# Patient Record
Sex: Male | Born: 1960 | Race: White | Hispanic: No | Marital: Married | Smoking: Never smoker
Health system: Southern US, Community
[De-identification: ages and names within clinical notes are randomized; demographics above are authoritative.]

## PROBLEM LIST (undated history)

## (undated) DIAGNOSIS — I10 Essential (primary) hypertension: Secondary | ICD-10-CM

## (undated) DIAGNOSIS — E119 Type 2 diabetes mellitus without complications: Secondary | ICD-10-CM

---

## 2001-02-07 ENCOUNTER — Emergency Department (HOSPITAL_COMMUNITY): Admission: EM | Admit: 2001-02-07 | Discharge: 2001-02-07 | Payer: Self-pay | Admitting: *Deleted

## 2003-04-30 ENCOUNTER — Emergency Department (HOSPITAL_COMMUNITY): Admission: EM | Admit: 2003-04-30 | Discharge: 2003-05-01 | Payer: Self-pay | Admitting: Emergency Medicine

## 2003-05-01 ENCOUNTER — Emergency Department (HOSPITAL_COMMUNITY): Admission: EM | Admit: 2003-05-01 | Discharge: 2003-05-01 | Payer: Self-pay | Admitting: Emergency Medicine

## 2004-09-04 ENCOUNTER — Emergency Department (HOSPITAL_COMMUNITY): Admission: EM | Admit: 2004-09-04 | Discharge: 2004-09-04 | Payer: Self-pay | Admitting: Family Medicine

## 2004-10-03 ENCOUNTER — Emergency Department (HOSPITAL_COMMUNITY): Admission: EM | Admit: 2004-10-03 | Discharge: 2004-10-03 | Payer: Self-pay | Admitting: Family Medicine

## 2005-12-15 ENCOUNTER — Emergency Department (HOSPITAL_COMMUNITY): Admission: EM | Admit: 2005-12-15 | Discharge: 2005-12-15 | Payer: Self-pay | Admitting: Family Medicine

## 2007-10-20 ENCOUNTER — Emergency Department (HOSPITAL_COMMUNITY): Admission: EM | Admit: 2007-10-20 | Discharge: 2007-10-20 | Payer: Self-pay | Admitting: Emergency Medicine

## 2007-10-31 ENCOUNTER — Emergency Department (HOSPITAL_COMMUNITY): Admission: EM | Admit: 2007-10-31 | Discharge: 2007-10-31 | Payer: Self-pay | Admitting: Family Medicine

## 2008-02-23 ENCOUNTER — Emergency Department (HOSPITAL_COMMUNITY): Admission: EM | Admit: 2008-02-23 | Discharge: 2008-02-23 | Payer: Self-pay | Admitting: Family Medicine

## 2008-07-25 ENCOUNTER — Emergency Department (HOSPITAL_COMMUNITY): Admission: EM | Admit: 2008-07-25 | Discharge: 2008-07-25 | Payer: Self-pay | Admitting: Family Medicine

## 2009-09-23 ENCOUNTER — Emergency Department (HOSPITAL_COMMUNITY): Admission: EM | Admit: 2009-09-23 | Discharge: 2009-09-23 | Payer: Self-pay | Admitting: Emergency Medicine

## 2009-09-24 ENCOUNTER — Emergency Department (HOSPITAL_COMMUNITY): Admission: EM | Admit: 2009-09-24 | Discharge: 2009-09-24 | Payer: Self-pay | Admitting: Family Medicine

## 2009-12-06 ENCOUNTER — Emergency Department (HOSPITAL_COMMUNITY)
Admission: EM | Admit: 2009-12-06 | Discharge: 2009-12-06 | Payer: Self-pay | Source: Home / Self Care | Admitting: Family Medicine

## 2010-04-22 LAB — POCT URINALYSIS DIP (DEVICE)
Glucose, UA: NEGATIVE mg/dL
Ketones, ur: NEGATIVE mg/dL
pH: 7 (ref 5.0–8.0)

## 2010-10-07 LAB — URINALYSIS, ROUTINE W REFLEX MICROSCOPIC
Bilirubin Urine: NEGATIVE
Ketones, ur: NEGATIVE
Specific Gravity, Urine: 1.022
pH: 5.5

## 2010-10-07 LAB — DIFFERENTIAL
Basophils Relative: 0
Eosinophils Absolute: 0.1
Eosinophils Relative: 1
Neutrophils Relative %: 85 — ABNORMAL HIGH

## 2010-10-07 LAB — CBC
MCHC: 34.1
RBC: 5.21
RDW: 12.9

## 2010-10-07 LAB — POCT I-STAT, CHEM 8
Creatinine, Ser: 1.1
HCT: 47
Hemoglobin: 16
TCO2: 27

## 2010-10-07 LAB — URINE MICROSCOPIC-ADD ON

## 2019-03-24 ENCOUNTER — Ambulatory Visit: Payer: Self-pay | Attending: Internal Medicine

## 2019-03-24 DIAGNOSIS — Z23 Encounter for immunization: Secondary | ICD-10-CM

## 2019-03-24 NOTE — Progress Notes (Signed)
   Covid-19 Vaccination Clinic  Name:  Sean Sutton    MRN: [id number] DOB: [birth year]  03/24/2019  Sean Sutton was observed post Covid-19 immunization for 15 minutes without incident. He was provided with Vaccine Information Sheet and instruction to access the V-Safe system.   Sean Sutton was instructed to call 911 with any severe reactions post vaccine: Marland Kitchen Difficulty breathing  . Swelling of face and throat  . A fast heartbeat  . A bad rash all over body  . Dizziness and weakness   Immunizations Administered    Name Date Dose VIS Date Route   Pfizer COVID-19 Vaccine 03/24/2019  9:07 AM 0.3 mL 12/16/2018 Intramuscular   Manufacturer: [employer], [employer]   Lot: [id number]   NDC: [address]-5

## 2019-04-18 ENCOUNTER — Ambulatory Visit: Payer: Self-pay | Attending: Internal Medicine

## 2019-04-18 DIAGNOSIS — Z23 Encounter for immunization: Secondary | ICD-10-CM

## 2019-04-18 NOTE — Progress Notes (Signed)
   Covid-19 Vaccination Clinic  Name:  Sean Sutton    MRN: [id number] DOB: 1960/11/11  04/18/2019  Mr. Sean Sutton was observed post Covid-19 immunization for 15 minutes without incident. He was provided with Vaccine Information Sheet and instruction to access the V-Safe system.   Mr. Sean Sutton was instructed to call 911 with any severe reactions post vaccine: Marland Kitchen Difficulty breathing  . Swelling of face and throat  . A fast heartbeat  . A bad rash all over body  . Dizziness and weakness   Immunizations Administered    Name Date Dose VIS Date Route   Pfizer COVID-19 Vaccine 04/18/2019  9:42 AM 0.3 mL 12/16/2018 Intramuscular   Manufacturer: [employer], [employer]   Lot: [id number]   NDC: [address]-9

## 2020-01-12 ENCOUNTER — Emergency Department (HOSPITAL_COMMUNITY)
Admission: EM | Admit: 2020-01-12 | Discharge: 2020-01-12 | Disposition: A | Discharge status: Home / Self Care | Payer: No Typology Code available for payment source | Attending: Emergency Medicine | Admitting: Emergency Medicine

## 2020-01-12 ENCOUNTER — Encounter (HOSPITAL_COMMUNITY): Payer: Self-pay | Admitting: *Deleted

## 2020-01-12 ENCOUNTER — Other Ambulatory Visit: Payer: Self-pay

## 2020-01-12 ENCOUNTER — Emergency Department (HOSPITAL_COMMUNITY): Payer: No Typology Code available for payment source

## 2020-01-12 DIAGNOSIS — E119 Type 2 diabetes mellitus without complications: Secondary | ICD-10-CM | POA: Insufficient documentation

## 2020-01-12 DIAGNOSIS — R202 Paresthesia of skin: Secondary | ICD-10-CM | POA: Insufficient documentation

## 2020-01-12 DIAGNOSIS — G510 Bell's palsy: Secondary | ICD-10-CM

## 2020-01-12 DIAGNOSIS — R29810 Facial weakness: Secondary | ICD-10-CM | POA: Insufficient documentation

## 2020-01-12 DIAGNOSIS — I10 Essential (primary) hypertension: Secondary | ICD-10-CM | POA: Insufficient documentation

## 2020-01-12 HISTORY — DX: Type 2 diabetes mellitus without complications: E11.9

## 2020-01-12 HISTORY — DX: Essential (primary) hypertension: I10

## 2020-01-12 MED ORDER — PREDNISONE 20 MG PO TABS
60.0000 mg | ORAL_TABLET | Freq: Once | ORAL | Status: AC
  Administered 2020-01-12: 60 mg via ORAL
  Filled 2020-01-12: qty 3

## 2020-01-12 MED ORDER — VALACYCLOVIR HCL 500 MG PO TABS
1000.0000 mg | ORAL_TABLET | Freq: Once | ORAL | Status: AC
  Administered 2020-01-12: 1000 mg via ORAL
  Filled 2020-01-12: qty 2

## 2020-01-12 MED ORDER — PREDNISONE 20 MG PO TABS: ORAL_TABLET | ORAL | 0 refills | Status: AC

## 2020-01-12 MED ORDER — VALACYCLOVIR HCL 1 G PO TABS: 1000.0000 mg | ORAL_TABLET | Freq: Three times a day (TID) | ORAL | 0 refills | Status: AC

## 2020-01-12 MED ORDER — ARTIFICIAL TEARS OPHTHALMIC OINT: TOPICAL_OINTMENT | Freq: Every evening | OPHTHALMIC | 0 refills | Status: AC | PRN

## 2020-01-12 NOTE — ED Provider Notes (Signed)
Care transferred to me.  On my exam, patient has a mild Bell's palsy overall but he does seem to have some mildly decreased forehead movement which would go along with a facial nerve palsy.  While he does have risk factor for stroke I think this is unlikely to be stroke and do not think emergent MRI is needed.  The CT head does not show any acute abnormalities besides some sinusitis.  He is afebrile.  He does have diabetes but seems to be decently controlled per him and so we will start on steroids, valacyclovir and Lacri-Lube.  Follow-up with ENT and PCP.  Return precautions.   Pricilla Loveless, MD 01/12/20 [phone]

## 2020-01-12 NOTE — ED Provider Notes (Signed)
[facility]-EMERGENCY DEPT Provider Note   CSN: [id number] Arrival date & time: 01/12/20  1320     History Chief Complaint  Patient presents with  . Facial Droop    Since 930am    Sean Sutton is a 60 y.o. male.  60 year old male with history of diabetes hypertension who presents with right-sided facial paresthesias and droop since this morning.  Denies any headaches.  No ear pain or rash.  Denies any visual changes such as loss of vision.  No weakness in his arms or legs.  No ataxia.  Has noted trouble closing his right eye and seems to be irritated.  Denies any tongue swelling or irritation.  Symptoms have been persistent and no prior history of same.        Past Medical History:  Diagnosis Date  . Diabetes mellitus without complication (HCC)   . Hypertension     There are no problems to display for this patient.   History reviewed. No pertinent surgical history.     No family history on file.  Social History   Tobacco Use  . Smoking status: Never Smoker  . Smokeless tobacco: Never Used  Substance Use Topics  . Alcohol use: Yes    Home Medications Prior to Admission medications   Not on File    Allergies    Patient has no allergy information on record.  Review of Systems   Review of Systems  All other systems reviewed and are negative.   Physical Exam Updated Vital Signs BP (!) 179/97 (BP Location: Right Arm)   Pulse 63   Temp 97.6 F (36.4 C) (Oral)   Resp 18   SpO2 99%   Physical Exam Vitals and nursing note reviewed.  Constitutional:      General: He is not in acute distress.    Appearance: Normal appearance. He is well-developed and well-nourished. He is not toxic-appearing.  HENT:     Head: Normocephalic and atraumatic.  Eyes:     General: Lids are normal.     Extraocular Movements: EOM normal.     Conjunctiva/sclera: Conjunctivae normal.     Pupils: Pupils are equal, round, and reactive to light.  Neck:      Thyroid: No thyroid mass.     Trachea: No tracheal deviation.  Cardiovascular:     Rate and Rhythm: Normal rate and regular rhythm.     Heart sounds: Normal heart sounds. No murmur heard. No gallop.   Pulmonary:     Effort: Pulmonary effort is normal. No respiratory distress.     Breath sounds: Normal breath sounds. No stridor. No decreased breath sounds, wheezing, rhonchi or rales.  Abdominal:     General: Bowel sounds are normal. There is no distension.     Palpations: Abdomen is soft.     Tenderness: There is no abdominal tenderness. There is no CVA tenderness or rebound.  Musculoskeletal:        General: No tenderness or edema. Normal range of motion.     Cervical back: Normal range of motion and neck supple.  Skin:    General: Skin is warm and dry.     Findings: No abrasion or rash.  Neurological:     Mental Status: He is alert and oriented to person, place, and time.     GCS: GCS eye subscore is 4. GCS verbal subscore is 5. GCS motor subscore is 6.     Cranial Nerves: Facial asymmetry present. No cranial nerve deficit  or dysarthria.     Sensory: Sensation is intact. No sensory deficit.     Motor: Motor function is intact.     Coordination: Coordination is intact.     Gait: Gait is intact.     Deep Tendon Reflexes: Strength normal.     Comments: Decrease sensation noted on right side of face compared to left.  Psychiatric:        Mood and Affect: Mood and affect normal.        Speech: Speech normal.        Behavior: Behavior normal.     ED Results / Procedures / Treatments   Labs (all labs ordered are listed, but only abnormal results are displayed) Labs Reviewed - No data to display  EKG None  Radiology No results found.  Procedures Procedures (including critical care time)  Medications Ordered in ED Medications - No data to display  ED Course  I have reviewed the triage vital signs and the nursing notes.  Pertinent labs & imaging results that were  available during my care of the patient were reviewed by me and considered in my medical decision making (see chart for details).    MDM Rules/Calculators/A&P                          Patient with likely Bell's palsy.  Will order head CT due to his history hypertension as well as diabetes.  Signed out to Dr. Gwenlyn Fudge Final Clinical Impression(s) / ED Diagnoses Final diagnoses:  None    Rx / DC Orders ED Discharge Orders    None       Lorre Nick, MD 01/12/20 1512

## 2020-01-12 NOTE — ED Triage Notes (Signed)
Pt complains of right lower facial droop and numbness since this morning. He noticed it at 930am , he woke up at 8am. No slurred speech, no arm drift. No dizziness.

## 2020-01-12 NOTE — Discharge Instructions (Signed)
If you develop headache, fever, neck stiffness, vomiting, blurry or double vision, weakness or numbness in your arms or legs, trouble speaking, or any other new/concerning symptoms then return to the ER for evaluation.

## 2020-02-09 ENCOUNTER — Ambulatory Visit: Payer: Self-pay | Admitting: Medical

## 2020-06-12 DIAGNOSIS — R809 Proteinuria, unspecified: Secondary | ICD-10-CM | POA: Diagnosis not present

## 2020-06-12 DIAGNOSIS — E1129 Type 2 diabetes mellitus with other diabetic kidney complication: Secondary | ICD-10-CM | POA: Diagnosis not present

## 2020-06-12 DIAGNOSIS — Z1159 Encounter for screening for other viral diseases: Secondary | ICD-10-CM | POA: Diagnosis not present

## 2020-06-12 DIAGNOSIS — I10 Essential (primary) hypertension: Secondary | ICD-10-CM | POA: Diagnosis not present

## 2020-06-12 DIAGNOSIS — K219 Gastro-esophageal reflux disease without esophagitis: Secondary | ICD-10-CM | POA: Diagnosis not present

## 2020-06-12 DIAGNOSIS — E785 Hyperlipidemia, unspecified: Secondary | ICD-10-CM | POA: Diagnosis not present

## 2020-06-12 DIAGNOSIS — Z79899 Other long term (current) drug therapy: Secondary | ICD-10-CM | POA: Diagnosis not present

## 2020-09-12 DIAGNOSIS — I10 Essential (primary) hypertension: Secondary | ICD-10-CM | POA: Diagnosis not present

## 2020-09-12 DIAGNOSIS — E785 Hyperlipidemia, unspecified: Secondary | ICD-10-CM | POA: Diagnosis not present

## 2020-09-12 DIAGNOSIS — M25511 Pain in right shoulder: Secondary | ICD-10-CM | POA: Diagnosis not present

## 2020-09-12 DIAGNOSIS — R809 Proteinuria, unspecified: Secondary | ICD-10-CM | POA: Diagnosis not present

## 2020-09-12 DIAGNOSIS — E1129 Type 2 diabetes mellitus with other diabetic kidney complication: Secondary | ICD-10-CM | POA: Diagnosis not present

## 2020-09-12 DIAGNOSIS — Z23 Encounter for immunization: Secondary | ICD-10-CM | POA: Diagnosis not present

## 2020-12-12 DIAGNOSIS — E1129 Type 2 diabetes mellitus with other diabetic kidney complication: Secondary | ICD-10-CM | POA: Diagnosis not present

## 2020-12-12 DIAGNOSIS — I10 Essential (primary) hypertension: Secondary | ICD-10-CM | POA: Diagnosis not present

## 2020-12-12 DIAGNOSIS — E785 Hyperlipidemia, unspecified: Secondary | ICD-10-CM | POA: Diagnosis not present

## 2020-12-12 DIAGNOSIS — R809 Proteinuria, unspecified: Secondary | ICD-10-CM | POA: Diagnosis not present

## 2021-01-02 DIAGNOSIS — R059 Cough, unspecified: Secondary | ICD-10-CM | POA: Diagnosis not present

## 2021-01-02 DIAGNOSIS — J329 Chronic sinusitis, unspecified: Secondary | ICD-10-CM | POA: Diagnosis not present

## 2021-01-02 DIAGNOSIS — R6889 Other general symptoms and signs: Secondary | ICD-10-CM | POA: Diagnosis not present

## 2021-01-08 ENCOUNTER — Other Ambulatory Visit: Payer: Self-pay

## 2021-01-08 ENCOUNTER — Ambulatory Visit: Payer: No Typology Code available for payment source | Attending: Orthopedic Surgery

## 2021-01-08 DIAGNOSIS — M25562 Pain in left knee: Secondary | ICD-10-CM | POA: Diagnosis present

## 2021-01-08 DIAGNOSIS — R2681 Unsteadiness on feet: Secondary | ICD-10-CM | POA: Diagnosis present

## 2021-01-08 DIAGNOSIS — M6281 Muscle weakness (generalized): Secondary | ICD-10-CM | POA: Insufficient documentation

## 2021-01-08 NOTE — Therapy (Signed)
OUTPATIENT PHYSICAL THERAPY LOWER EXTREMITY EVALUATION   Patient Name: Sean Sutton MRN: [id number] DOB:[birth year], 61 y.o., male Today's Date: 01/08/2021   PT End of Session - 01/08/21 1031     Visit Number 1    Number of Visits 15    Date for PT Re-Evaluation 02/05/21    Authorization Type [facility]    Authorization Time Period 01/08/21-03/05/21    Progress Note Due on Visit 10    PT Start Time 1035    PT Stop Time 1130    PT Time Calculation (min) 55 min    Activity Tolerance Patient tolerated treatment well    Behavior During Therapy WFL for tasks assessed/performed             Past Medical History:  Diagnosis Date   Diabetes mellitus without complication (HCC)    Hypertension    History reviewed. No pertinent surgical history. There are no problems to display for this patient.   PCP: [facility]  REFERRING PROVIDER: Julien Girt, Judeth Porch,*  REFERRING DIAG: L knee arthroscopic 10/17/20  THERAPY DIAG: L knee arthroscopic 10/17/20  ONSET DATE: 10/17/20  SUBJECTIVE:   SUBJECTIVE STATEMENT: L knee mensicus repair, symptoms ongoing for several years   PERTINENT HISTORY: Previous PT due to history of chronic L knee pain, underwent several injections, post-op NWB x 6 weeks, incurred post-op infection and cellulitis  PAIN:  Are you having pain? Yes VAS scale: 4/10 Pain location: L knee Pain orientation: Left  PAIN TYPE: dull Pain description: intermittent  Aggravating factors: Kneeling, bending, overuse Relieving factors: rest  PRECAUTIONS: None  WEIGHT BEARING RESTRICTIONS No  FALLS:  Has patient fallen in last 6 months? No, Number of falls: 0  LIVING ENVIRONMENT: Lives with: lives with their family Lives in: House/apartment Stairs: No;   OCCUPATION: retired  PLOF: Independent  PATIENT GOALS To regain ROM, strength and function   OBJECTIVE:   DIAGNOSTIC FINDINGS: N/A  PATIENT SURVEYS:  FOTO 41    MUSCLE LENGTH: Hamstrings:  Right 80 deg; Left 70 deg Thomas test: negative L  POSTURE:  NT  PALPATION: TTP medial joint line L knee  LE AROM/PROM:  A/PROM Right 01/08/2021 Left 01/08/2021  Hip flexion [facility]  Hip extension    Hip abduction    Hip adduction    Hip internal rotation    Hip external rotation    Knee flexion 134 104  Knee extension 9 -2/0  Ankle dorsiflexion    Ankle plantarflexion    Ankle inversion    Ankle eversion     (Blank rows = not tested)  LE MMT:  MMT Right 01/08/2021 Left 01/08/2021  Hip flexion 5 5  Hip extension 5 5  Hip abduction    Hip adduction    Hip internal rotation    Hip external rotation    Knee flexion 5 4  Knee extension 5 4  Ankle dorsiflexion    Ankle plantarflexion    Ankle inversion    Ankle eversion     (Blank rows = not tested)  LOWER EXTREMITY SPECIAL TESTS:  Hip special tests: Maisie Fus test: negative Knee special tests: Patellafemoral apprehension test: negative and Patellafemoral grind test: negative  FUNCTIONAL TESTS:  5 times sit to stand: 16s with BUE support  GAIT: Distance walked: 247ft Assistive device utilized: None Level of assistance: Modified independence Comments: decreased WB LLE    TODAY'S TREATMENT: HEP, Eval   PATIENT EDUCATION:  Education details: Discussed eval findings, rehab rationale and POC and patient  is in agreement  Person educated: Patient Education method: Explanation, Demonstration, and Handouts Education comprehension: verbalized understanding and returned demonstration   HOME EXERCISE PROGRAM: Access Code: [email or web address] URL: [email or web address] Date: 01/08/2021 Prepared by: Gustavus Bryant  Exercises Sidelying Hip Abduction - 2 x daily - 7 x weekly - 1 sets - 30 reps Supine Straight Leg Raises - 2 x daily - 7 x weekly - 1 sets - 30 reps Long Sitting Quad Set - 2 x daily - 7 x weekly - 1 sets - 30 reps Supine Heel Slide with Strap - 2 x daily - 7 x weekly - 1 sets - 30  reps   ASSESSMENT:  CLINICAL IMPRESSION: Patient is a 61 y.o. male who was seen today for physical therapy evaluation and treatment for L knee pain and weakness. He ambulates with decreased WB on LLE, decreased stance time on L and decreased flexion in swing phase.Objective impairments include Abnormal gait, decreased activity tolerance, decreased balance, decreased endurance, decreased mobility, difficulty walking, decreased ROM, decreased strength, and impaired flexibility. These impairments are limiting patient from community activity, driving, yard work, and shopping. Personal factors including Age, Past/current experiences, and Time since onset of injury/illness/exacerbation are also affecting patient's functional outcome. Patient will benefit from skilled PT to address above impairments and improve overall function.  REHAB POTENTIAL: Good  CLINICAL DECISION MAKING: Stable/uncomplicated  EVALUATION COMPLEXITY: Low   GOALS: Goals reviewed with patient? Yes  SHORT TERM GOALS:  STG Name Target Date Goal status  1 Patient to demonstrate independence in HEP  Baseline: Access Code: [email or web address] U  02/05/2021 INITIAL  2 Complete  STS w/o UE support Baseline: Requires BUE support 02/05/2021 INITIAL  3 110d L knee flexion AROM Baseline:104d AROM in flexion 02/05/2021 INITIAL  4 Decrease pain to 2/10 Baseline:4/10 02/05/2021 INITIAL   LONG TERM GOALS:   LTG Name Target Date Goal status  1 135d AROM flexion in L knee Baseline:104d flexion 03/05/2021 INITIAL  2 FOTO score of 58 Baseline:41 03/05/2021 INITIAL  3 5/5 L kne extension Baseline: 03/05/2021 INITIAL  4 Patient to perform 5x STS in 15s w/o UE support Baseline: 03/05/2021 INITIAL   PLAN: PT FREQUENCY: 2x/week  PT DURATION: 8 weeks  PLANNED INTERVENTIONS: Therapeutic exercises, Therapeutic activity, Neuro Muscular re-education, Balance training, Gait training, Patient/Family education, Joint mobilization, Stair training, and DME  instructions  PLAN FOR NEXT SESSION: HEP f/u, knee ROM, strength and balance tasks    Hildred Laser PT 01/08/2021, 10:33 AM

## 2021-01-13 ENCOUNTER — Ambulatory Visit: Payer: No Typology Code available for payment source

## 2021-01-13 ENCOUNTER — Other Ambulatory Visit: Payer: Self-pay

## 2021-01-13 DIAGNOSIS — M6281 Muscle weakness (generalized): Secondary | ICD-10-CM

## 2021-01-13 DIAGNOSIS — R2681 Unsteadiness on feet: Secondary | ICD-10-CM

## 2021-01-13 DIAGNOSIS — M25562 Pain in left knee: Secondary | ICD-10-CM

## 2021-01-13 NOTE — Therapy (Signed)
OUTPATIENT PHYSICAL THERAPY TREATMENT NOTE   Patient Name: Sean Sutton MRN: [id number] DOB:1961/01/02, 61 y.o., male Today's Date: 01/13/2021  PCP: [facility] REFERRING PROVIDER: Lauraine Rinne,*   PT End of Session - 01/13/21 4270     Visit Number 2    Number of Visits 15    Date for PT Re-Evaluation 02/05/21    Authorization Type [facility]    Authorization Time Period 01/08/21-03/05/21    Progress Note Due on Visit 10    PT Start Time 0830    PT Stop Time 0915    PT Time Calculation (min) 45 min    Activity Tolerance Patient tolerated treatment well    Behavior During Therapy [facility] for tasks assessed/performed             Past Medical History:  Diagnosis Date   Diabetes mellitus without complication (HCC)    Hypertension    History reviewed. No pertinent surgical history. There are no problems to display for this patient.   REFERRING DIAG: L meniscus repair   THERAPY DIAG: L knee meniscus tear   PERTINENT HISTORY: see eval  PRECAUTIONS: none  SUBJECTIVE: No changes to report  PAIN:  Are you having pain? No VAS scale: 2/10 Pain location: L knee Pain orientation: Left  PAIN TYPE: aching Pain description: intermittent      OBJECTIVE:    DIAGNOSTIC FINDINGS: N/A   PATIENT SURVEYS:  FOTO 41       MUSCLE LENGTH: Hamstrings: Right 80 deg; Left 70 deg Thomas test: negative L   POSTURE:  NT   PALPATION: TTP medial joint line L knee   LE AROM/PROM:   A/PROM Right 01/08/2021 Left 01/08/2021  Hip flexion [facility]  Hip extension      Hip abduction      Hip adduction      Hip internal rotation      Hip external rotation      Knee flexion 134 104  Knee extension 9 -2/0  Ankle dorsiflexion      Ankle plantarflexion      Ankle inversion      Ankle eversion       (Blank rows = not tested)   LE MMT:   MMT Right 01/08/2021 Left 01/08/2021  Hip flexion 5 5  Hip extension 5 5  Hip abduction      Hip adduction      Hip internal  rotation      Hip external rotation      Knee flexion 5 4  Knee extension 5 4  Ankle dorsiflexion      Ankle plantarflexion      Ankle inversion      Ankle eversion       (Blank rows = not tested)   LOWER EXTREMITY SPECIAL TESTS:  Hip special tests: Maisie Fus test: negative Knee special tests: Patellafemoral apprehension test: negative and Patellafemoral grind test: negative   FUNCTIONAL TESTS:  5 times sit to stand: 16s with BUE support   GAIT: Distance walked: 215ft Assistive device utilized: None Level of assistance: Modified independence Comments: decreased WB LLE       TODAY'S TREATMENT: [facility] Adult PT Treatment:                                                DATE: 01/13/21 Therapeutic Exercise: Nustep L2, arms 8,  8 min. Heelslides with strap x 30 SLR x30 L hamstring stretch 30s x3 SAQs x30 Sidelie abduction x30 Bridge with ball squeeze Hooklie clams, yellow band, 30x LAQs with ball squeeze 30x Manual Therapy: Medial patellar glides     PATIENT EDUCATION:  Education details: Discussed eval findings, rehab rationale and POC and patient is in agreement  Person educated: Patient Education method: Explanation, Demonstration, and Handouts Education comprehension: verbalized understanding and returned demonstration     HOME EXERCISE PROGRAM: Access Code: [email or web address] URL: [email or web address] Date: 01/08/2021 Prepared by: Gustavus Bryant   Exercises Sidelying Hip Abduction - 2 x daily - 7 x weekly - 1 sets - 30 reps Supine Straight Leg Raises - 2 x daily - 7 x weekly - 1 sets - 30 reps Long Sitting Quad Set - 2 x daily - 7 x weekly - 1 sets - 30 reps Supine Heel Slide with Strap - 2 x daily - 7 x weekly - 1 sets - 30 reps     ASSESSMENT:   CLINICAL IMPRESSION: No increased symptoms following last session.  Began aerobic conditioning, ROM tasks, strengthening of L knee and hip.  Added patellar mobs to stretch lateral patellar ligaments, showing signs  of ITB tightness most likely due to immobility following surgery and NWB status.  108d flexion following heel slides.    REHAB POTENTIAL: Good   CLINICAL DECISION MAKING: Stable/uncomplicated   EVALUATION COMPLEXITY: Low     GOALS: Goals reviewed with patient? Yes   SHORT TERM GOALS:   STG Name Target Date Goal status  1 Patient to demonstrate independence in HEP  Baseline: Access Code: [email or web address] U   02/05/2021 INITIAL  2 Complete  STS w/o UE support Baseline: Requires BUE support 02/05/2021 INITIAL  3 110d L knee flexion AROM Baseline:104d AROM in flexion 02/05/2021 INITIAL  4 Decrease pain to 2/10 Baseline:4/10 02/05/2021 INITIAL    LONG TERM GOALS:    LTG Name Target Date Goal status  1 135d AROM flexion in L knee Baseline:104d flexion 03/05/2021 INITIAL  2 FOTO score of 58 Baseline:41 03/05/2021 INITIAL  3 5/5 L knee extension Baseline: 4/5 03/05/2021 INITIAL  4 Patient to perform 5x STS in 15s w/o UE support Baseline: 03/05/2021 INITIAL    PLAN: PT FREQUENCY: 2x/week   PT DURATION: 8 weeks   PLANNED INTERVENTIONS: Therapeutic exercises, Therapeutic activity, Neuro Muscular re-education, Balance training, Gait training, Patient/Family education, Joint mobilization, Stair training, and DME instructions   PLAN FOR NEXT SESSION: L knee ROM, strength and balance tasks, CKC, patellar mob and ITB stretch    Farris Has Armetta Henri PT 01/13/2021, 8:59 AM

## 2021-01-15 ENCOUNTER — Other Ambulatory Visit: Payer: Self-pay

## 2021-01-15 ENCOUNTER — Ambulatory Visit: Payer: No Typology Code available for payment source | Admitting: Physical Therapy

## 2021-01-15 ENCOUNTER — Encounter: Payer: Self-pay | Admitting: Physical Therapy

## 2021-01-15 DIAGNOSIS — M6281 Muscle weakness (generalized): Secondary | ICD-10-CM

## 2021-01-15 DIAGNOSIS — R2681 Unsteadiness on feet: Secondary | ICD-10-CM

## 2021-01-15 DIAGNOSIS — M25562 Pain in left knee: Secondary | ICD-10-CM

## 2021-01-15 NOTE — Therapy (Addendum)
OUTPATIENT PHYSICAL THERAPY TREATMENT NOTE   Patient Name: Sean Sutton MRN: [id number] DOB:06-Feb-1960, 61 y.o., male Today's Date: 01/15/2021  PCP: [facility] REFERRING PROVIDER: Lauraine Rinne,*   PT End of Session - 01/15/21 1021     Visit Number 3    Number of Visits 15    Date for PT Re-Evaluation 02/05/21    Authorization Type [facility]    Authorization Time Period 01/08/21-03/05/21    Progress Note Due on Visit 10    PT Start Time 1016    PT Stop Time 1100    PT Time Calculation (min) 44 min             Past Medical History:  Diagnosis Date   Diabetes mellitus without complication (HCC)    Hypertension    History reviewed. No pertinent surgical history. There are no problems to display for this patient.   REFERRING DIAG: L meniscus repair   THERAPY DIAG: L knee meniscus tear   PERTINENT HISTORY: see eval  PRECAUTIONS: none  SUBJECTIVE: No changes to report  PAIN:  Are you having pain? No VAS scale: 0/10 Pain location: L knee Pain orientation: Left  PAIN TYPE: stiff Pain description: intermittent      OBJECTIVE:    DIAGNOSTIC FINDINGS: N/A   PATIENT SURVEYS:  FOTO 41       MUSCLE LENGTH: Hamstrings: Right 80 deg; Left 70 deg Thomas test: negative L   POSTURE:  NT   PALPATION: TTP medial joint line L knee   LE AROM/PROM:   A/PROM Right 01/08/2021 Left 01/08/2021 Left  01/15/2021  Hip flexion [facility]   Hip extension       Hip abduction       Hip adduction       Hip internal rotation       Hip external rotation       Knee flexion 134 104 125  Knee extension 9 -2/0   Ankle dorsiflexion       Ankle plantarflexion       Ankle inversion       Ankle eversion        (Blank rows = not tested)   LE MMT:   MMT Right 01/08/2021 Left 01/08/2021  Hip flexion 5 5  Hip extension 5 5  Hip abduction      Hip adduction      Hip internal rotation      Hip external rotation      Knee flexion 5 4  Knee extension 5 4   Ankle dorsiflexion      Ankle plantarflexion      Ankle inversion      Ankle eversion       (Blank rows = not tested)   LOWER EXTREMITY SPECIAL TESTS:  Hip special tests: Maisie Fus test: negative Knee special tests: Patellafemoral apprehension test: negative and Patellafemoral grind test: negative   FUNCTIONAL TESTS:  5 times sit to stand: 16s with BUE support   GAIT: Distance walked: 289ft Assistive device utilized: None Level of assistance: Modified independence Comments: decreased WB LLE     TODAY'S TREATMENT: [facility] Adult PT Treatment:                                                DATE: 01/15/21 Therapeutic Exercise: Nustep L5, arms 9, 6 min Standing knee flexion x  10 Gastroc stretch 2 x 30 sec each Tandem stance 30 sec -added head turns for challenge Standing ITB stretch bilat x 30 sec  Seated hamstring curls red band x 20 L LAQs with ball squeeze 30x L hamstring stretch 30s x3, supine with strap L ITB stretch supine with strap  Heelslides with strap x 30- ROM 125 After SLR x30  Manual Therapy: Medial patellar glides      [facility] Adult PT Treatment:                                                DATE: 01/13/21 Therapeutic Exercise: Nustep L2, arms 8, 8 min. Heelslides with strap x 30 SLR x30 L hamstring stretch 30s x3 SAQs x30 Sidelie abduction x30 Bridge with ball squeeze Hooklie clams, yellow band, 30x LAQs with ball squeeze 30x Manual Therapy: Medial patellar glides     PATIENT EDUCATION:  Education details: continue current HEP Person educated: Patient Education method: Explanation, Demonstration, and Handouts Education comprehension: verbalized understanding and returned demonstration     HOME EXERCISE PROGRAM: Access Code: [email or web address] URL: [email or web address] Date: 01/08/2021 Prepared by: Gustavus Bryant   Exercises Sidelying Hip Abduction - 2 x daily - 7 x weekly - 1 sets - 30 reps Supine Straight Leg Raises - 2 x daily - 7 x weekly -  1 sets - 30 reps Long Sitting Quad Set - 2 x daily - 7 x weekly - 1 sets - 30 reps Supine Heel Slide with Strap - 2 x daily - 7 x weekly - 1 sets - 30 reps     ASSESSMENT:   CLINICAL IMPRESSION: Pt reports his knee is mostly just stiff, not painful. Continued with focus of right knee ROM. Began ITB stretching and balance tasks. Dynamic tandem stance was challenging. 125 d knee flexion following heel slides today.    REHAB POTENTIAL: Good   CLINICAL DECISION MAKING: Stable/uncomplicated   EVALUATION COMPLEXITY: Low     GOALS: Goals reviewed with patient? Yes   SHORT TERM GOALS:   STG Name Target Date Goal status  1 Patient to demonstrate independence in HEP  Baseline: Access Code: [email or web address]   02/05/2021 INITIAL  2 Complete  STS w/o UE support Baseline: Requires BUE support 02/05/2021 INITIAL  3 110d L knee flexion AROM Baseline:104d AROM in flexion 02/05/2021 INITIAL  4 Decrease pain to 2/10 Baseline:4/10 02/05/2021 INITIAL    LONG TERM GOALS:    LTG Name Target Date Goal status  1 135d AROM flexion in L knee Baseline:104d flexion 03/05/2021 INITIAL  2 FOTO score of 58 Baseline:41 03/05/2021 INITIAL  3 5/5 L knee extension Baseline: 4/5 03/05/2021 INITIAL  4 Patient to perform 5x STS in 15s w/o UE support Baseline: 03/05/2021 INITIAL    PLAN: PT FREQUENCY: 2x/week   PT DURATION: 8 weeks   PLANNED INTERVENTIONS: Therapeutic exercises, Therapeutic activity, Neuro Muscular re-education, Balance training, Gait training, Patient/Family education, Joint mobilization, Stair training, and DME instructions   PLAN FOR NEXT SESSION: L knee ROM, strength and balance tasks, CKC, patellar mob and ITB stretch   Jannette Spanner, PTA 01/15/21 12:19 PM Phone: [phone] Fax: [phone]

## 2021-01-20 ENCOUNTER — Ambulatory Visit: Payer: No Typology Code available for payment source

## 2021-01-20 ENCOUNTER — Other Ambulatory Visit: Payer: Self-pay

## 2021-01-20 DIAGNOSIS — M25562 Pain in left knee: Secondary | ICD-10-CM

## 2021-01-20 DIAGNOSIS — M6281 Muscle weakness (generalized): Secondary | ICD-10-CM

## 2021-01-20 DIAGNOSIS — R2681 Unsteadiness on feet: Secondary | ICD-10-CM

## 2021-01-20 NOTE — Therapy (Signed)
OUTPATIENT PHYSICAL THERAPY TREATMENT NOTE   Patient Name: Sean Sutton MRN: [id number] DOB:1960/07/24, 61 y.o., male Today's Date: 01/20/2021  [facility] REFERRING PROVIDER: Lauraine RinnePerkins, Alexzandrew L,*   PT End of Session - 01/20/21 [id number]     Visit Number 4    Number of Visits 15    Date for PT Re-Evaluation 02/05/21    Authorization Type [facility]    Authorization Time Period 01/08/21-03/05/21    Progress Note Due on Visit 10    PT Start Time 0840    PT Stop Time 0915    PT Time Calculation (min) 35 min    Activity Tolerance Patient tolerated treatment well    Behavior During [facility] for tasks assessed/performed             Past Medical History:  Diagnosis Date   Diabetes mellitus without complication (HCC)    Hypertension    History reviewed. No pertinent surgical history. There are no problems to display for this patient.   REFERRING DIAG: L meniscus repair   THERAPY DIAG: L knee meniscus tear   PERTINENT HISTORY: see eval  PRECAUTIONS: none  SUBJECTIVE: No distinct pain to report, feels knee is stiff but not painful.  PAIN:  Are you having pain? No VAS scale: 0/10 Pain location: L knee Pain orientation: Left  PAIN TYPE: stiff Pain description: intermittent      OBJECTIVE:    DIAGNOSTIC FINDINGS: N/A   PATIENT SURVEYS:  FOTO 41       MUSCLE LENGTH: Hamstrings: Right 80 deg; Left 70 deg Thomas test: negative L   POSTURE:  NT   PALPATION: TTP medial joint line L knee   LE AROM/PROM:   A/PROM Right 01/08/2021 Left 01/08/2021 Left  01/15/2021  Hip flexion [facility]   Hip extension       Hip abduction       Hip adduction       Hip internal rotation       Hip external rotation       Knee flexion 134 104 125  Knee extension 9 -2/0   Ankle dorsiflexion       Ankle plantarflexion       Ankle inversion       Ankle eversion        (Blank rows = not tested)   LE MMT:   MMT Right 01/08/2021 Left 01/08/2021  Hip flexion 5  5  Hip extension 5 5  Hip abduction      Hip adduction      Hip internal rotation      Hip external rotation      Knee flexion 5 4  Knee extension 5 4  Ankle dorsiflexion      Ankle plantarflexion      Ankle inversion      Ankle eversion       (Blank rows = not tested)   LOWER EXTREMITY SPECIAL TESTS:  Hip special tests: Maisie Fushomas test: negative Knee special tests: Patellafemoral apprehension test: negative and Patellafemoral grind test: negative   FUNCTIONAL TESTS:  5 times sit to stand: 16s with BUE support   GAIT: Distance walked: 26200ft Assistive device utilized: None Level of assistance: Modified independence Comments: decreased WB LLE     TODAY'S TREATMENT: [facility] Adult PT Treatment:  DATE: 01/20/21 Therapeutic Exercise: Nustep L4, arms 8, 8 min SAQs 5# x30 Bridge L only, 15x2 Heelslides with strap x 30- ROM 132 After including posterior tibial glides Neuromuscular re-ed: Posterior tibial glides with heel slides McConnel tape medial glide to be worn 24 hrs, discussed precautions     [facility] Adult PT Treatment:                                                DATE: 01/15/21 Therapeutic Exercise: Nustep L5, arms 9, 6 min Standing knee flexion x 10 Gastroc stretch 2 x 30 sec each Tandem stance 30 sec -added head turns for challenge Standing ITB stretch bilat x 30 sec  Seated hamstring curls red band x 20 L LAQs with ball squeeze 30x L hamstring stretch 30s x3, supine with strap L ITB stretch supine with strap  Heelslides with strap x 30- ROM 125 After SLR x30  Manual Therapy: Medial patellar glides      [facility] Adult PT Treatment:                                                DATE: 01/13/21 Therapeutic Exercise: Nustep L2, arms 8, 8 min. Heelslides with strap x 30 SLR x30 L hamstring stretch 30s x3 SAQs x30 Sidelie abduction x30 Bridge with ball squeeze Hooklie clams, yellow band, 30x LAQs with ball squeeze 30x Manual  Therapy: Medial patellar glides     PATIENT EDUCATION:  Education details: continue current HEP Person educated: Patient Education method: Explanation, Demonstration, and Handouts Education comprehension: verbalized understanding and returned demonstration     HOME EXERCISE PROGRAM: Access Code: [email or web address] URL: [email or web address] Date: 01/08/2021 Prepared by: Gustavus Bryant   Exercises Sidelying Hip Abduction - 2 x daily - 7 x weekly - 1 sets - 30 reps Supine Straight Leg Raises - 2 x daily - 7 x weekly - 1 sets - 30 reps Long Sitting Quad Set - 2 x daily - 7 x weekly - 1 sets - 30 reps Supine Heel Slide with Strap - 2 x daily - 7 x weekly - 1 sets - 30 reps     ASSESSMENT:   CLINICAL IMPRESSION: L knee function continues to improve, pain rated at 0/10, just stiffness reported, L ITB mobility increasing, not able to STS transfer w/o UE Assist and knee flexion continues to improve. REHAB POTENTIAL: Good   CLINICAL DECISION MAKING: Stable/uncomplicated   EVALUATION COMPLEXITY: Low     GOALS: Goals reviewed with patient? Yes   SHORT TERM GOALS:   STG Name Target Date Goal status  1 Patient to demonstrate independence in HEP  Baseline: Access Code: [email or web address] U   02/05/2021 INITIAL  2 Complete  STS w/o UE support Baseline: Requires BUE support 02/05/2021 INITIAL  3 110d L knee flexion AROM Baseline:104d AROM in flexion 02/05/2021 INITIAL  4 Decrease pain to 2/10 Baseline:4/10 02/05/2021 INITIAL    LONG TERM GOALS:    LTG Name Target Date Goal status  1 135d AROM flexion in L knee Baseline:104d flexion 03/05/2021 INITIAL  2 FOTO score of 58 Baseline:41 03/05/2021 INITIAL  3 5/5 L knee extension Baseline: 4/5 03/05/2021 INITIAL  4 Patient to perform 5x STS in 15s w/o UE support Baseline:  03/05/2021 INITIAL    PLAN: PT FREQUENCY: 2x/week   PT DURATION: 8 weeks   PLANNED INTERVENTIONS: Therapeutic exercises, Therapeutic activity, Neuro Muscular re-education,  Balance training, Gait training, Patient/Family education, Joint mobilization, Stair training, and DME instructions   PLAN FOR NEXT SESSION: L knee ROM, strength and balance tasks, CKC, patellar mob and ITB stretch   Learta Codding PT 01/20/21 8:40 AM Phone: [phone] Fax: [phone]

## 2021-01-22 ENCOUNTER — Other Ambulatory Visit: Payer: Self-pay

## 2021-01-22 ENCOUNTER — Ambulatory Visit: Payer: No Typology Code available for payment source

## 2021-01-22 DIAGNOSIS — M6281 Muscle weakness (generalized): Secondary | ICD-10-CM

## 2021-01-22 DIAGNOSIS — R2681 Unsteadiness on feet: Secondary | ICD-10-CM

## 2021-01-22 DIAGNOSIS — M25562 Pain in left knee: Secondary | ICD-10-CM | POA: Diagnosis not present

## 2021-01-22 NOTE — Therapy (Signed)
OUTPATIENT PHYSICAL THERAPY TREATMENT NOTE   Patient Name: Sean Sutton MRN: [id number] DOB:[birth year], 61 y.o., male Today's Date: 01/22/2021  [facility] REFERRING PROVIDER: Lauraine Rinne,*   PT End of Session - 01/22/21 1002     Visit Number 5    Number of Visits 15    Date for PT Re-Evaluation 02/05/21    Authorization Type [facility]    Authorization Time Period 01/08/21-03/05/21    Progress Note Due on Visit 10    PT Start Time 1000    PT Stop Time 1045    PT Time Calculation (min) 45 min             Past Medical History:  Diagnosis Date   Diabetes mellitus without complication (HCC)    Hypertension    History reviewed. No pertinent surgical history. There are no problems to display for this patient.   REFERRING DIAG: L meniscus repair   THERAPY DIAG: L knee meniscus tear   PERTINENT HISTORY: see eval  PRECAUTIONS: none  SUBJECTIVE: No pain reported, main concern is continued tightness especially to lateral aspect of L knee.  Taping helped a little but came loose eventually.  PAIN:  Are you having pain? No VAS scale: 0/10 Pain location: L knee Pain orientation: Left  PAIN TYPE: stiff Pain description: intermittent      OBJECTIVE:    DIAGNOSTIC FINDINGS: N/A   PATIENT SURVEYS:  FOTO 41       MUSCLE LENGTH: Hamstrings: Right 80 deg; Left 70 deg Thomas test: negative L   POSTURE:  NT   PALPATION: TTP medial joint line L knee   LE AROM/PROM:   A/PROM Right 01/08/2021 Left 01/08/2021 Left  01/15/2021  Hip flexion [facility]   Hip extension       Hip abduction       Hip adduction       Hip internal rotation       Hip external rotation       Knee flexion 134 104 125  Knee extension 9 -2/0   Ankle dorsiflexion       Ankle plantarflexion       Ankle inversion       Ankle eversion        (Blank rows = not tested)   LE MMT:   MMT Right 01/08/2021 Left 01/08/2021  Hip flexion 5 5  Hip extension 5 5  Hip abduction       Hip adduction      Hip internal rotation      Hip external rotation      Knee flexion 5 4  Knee extension 5 4  Ankle dorsiflexion      Ankle plantarflexion      Ankle inversion      Ankle eversion       (Blank rows = not tested)   LOWER EXTREMITY SPECIAL TESTS:  Hip special tests: Maisie Fus test: negative Knee special tests: Patellafemoral apprehension test: negative and Patellafemoral grind test: negative   FUNCTIONAL TESTS:  5 times sit to stand: 16s with BUE support   GAIT: Distance walked: 251ft Assistive device utilized: None Level of assistance: Modified independence Comments: decreased WB LLE     TODAY'S TREATMENT: [facility] Adult PT Treatment:  DATE: 01/22/21 Therapeutic Exercise: Nustep L4, arms 8, seat 6, 8 min L ITB stretch 30s x3 Heelslides with strap x30, initially 130d flexion, 132d at end of task, 142d R Sidelie L hip abduction x30 Single leg bridge L 30x SAQs 7 1/2# 30x  Therapeutic Activity: Step ups 4" x30 Lateral step up 4" x30 Leg press 45# x30 5x STS from mat 24s  [facility]                                                DATE: 01/20/21 Therapeutic Exercise: Nustep L4, arms 8, 8 min SAQs 5# x30 Bridge L only, 15x2 Heelslides with strap x 30- ROM 132 After including posterior tibial glides Neuromuscular re-ed: Posterior tibial glides with heel slides McConnel tape medial glide to be worn 24 hrs, discussed precautions     [facility] Adult PT Treatment:                                                DATE: 01/15/21 Therapeutic Exercise: Nustep L5, arms 9, 6 min Standing knee flexion x 10 Gastroc stretch 2 x 30 sec each Tandem stance 30 sec -added head turns for challenge Standing ITB stretch bilat x 30 sec  Seated hamstring curls red band x 20 L LAQs with ball squeeze 30x L hamstring stretch 30s x3, supine with strap L ITB stretch supine with strap  Heelslides with strap x 30- ROM 125  After SLR x30  Manual Therapy: Medial patellar glides      [facility] Adult PT Treatment:                                                DATE: 01/13/21 Therapeutic Exercise: Nustep L2, arms 8, 8 min. Heelslides with strap x 30 SLR x30 L hamstring stretch 30s x3 SAQs x30 Sidelie abduction x30 Bridge with ball squeeze Hooklie clams, yellow band, 30x LAQs with ball squeeze 30x Manual Therapy: Medial patellar glides     PATIENT EDUCATION:  Education details: continue current HEP Person educated: Patient Education method: Explanation, Demonstration, and Handouts Education comprehension: verbalized understanding and returned demonstration     HOME EXERCISE PROGRAM: Access Code: [email or web address] URL: [email or web address] Date: 01/08/2021 Prepared by: Gustavus BryantJeffrey Margaurite Salido   Exercises Sidelying Hip Abduction - 2 x daily - 7 x weekly - 1 sets - 30 reps Supine Straight Leg Raises - 2 x daily - 7 x weekly - 1 sets - 30 reps Long Sitting Quad Set - 2 x daily - 7 x weekly - 1 sets - 30 reps Supine Heel Slide with Strap - 2 x daily - 7 x weekly - 1 sets - 30 reps     ASSESSMENT:   CLINICAL IMPRESSION: L knee pain appears to have resolved, main complaint now is tightness to lateral aspect.  L ITB restrictions still noted REHAB POTENTIAL: Good   CLINICAL DECISION MAKING: Stable/uncomplicated   EVALUATION COMPLEXITY: Low     GOALS: Goals reviewed with patient? Yes   SHORT TERM GOALS:   STG Name Target Date Goal status  1 Patient to demonstrate  independence in HEP  Baseline: Access Code: [email or web address] U   02/05/2021 INITIAL  2 Complete  STS w/o UE support Baseline: Requires BUE support 02/05/2021 INITIAL  3 110d L knee flexion AROM Baseline:104d AROM in flexion 02/05/2021 INITIAL  4 Decrease pain to 2/10 Baseline:4/10 02/05/2021 INITIAL    LONG TERM GOALS:    LTG Name Target Date Goal status  1 135d AROM flexion in L knee Baseline:104d flexion 03/05/2021 INITIAL  2 FOTO score of  58 Baseline:41 03/05/2021 INITIAL  3 5/5 L knee extension Baseline: 4/5 03/05/2021 INITIAL  4 Patient to perform 5x STS in 15s w/o UE support Baseline: 03/05/2021 INITIAL    PLAN: PT FREQUENCY: 2x/week   PT DURATION: 8 weeks   PLANNED INTERVENTIONS: Therapeutic exercises, Therapeutic activity, Neuro Muscular re-education, Balance training, Gait training, Patient/Family education, Joint mobilization, Stair training, and DME instructions   PLAN FOR NEXT SESSION: L knee ROM, strength and balance tasks, CKC, patellar mob and ITB stretch, stepping tasks and HEP update   Learta Codding PT 01/22/21 10:03 AM Phone: [phone] Fax: [phone]

## 2021-01-27 ENCOUNTER — Other Ambulatory Visit: Payer: Self-pay

## 2021-01-27 ENCOUNTER — Ambulatory Visit: Payer: No Typology Code available for payment source

## 2021-01-27 DIAGNOSIS — M25562 Pain in left knee: Secondary | ICD-10-CM

## 2021-01-27 DIAGNOSIS — R2681 Unsteadiness on feet: Secondary | ICD-10-CM

## 2021-01-27 DIAGNOSIS — M6281 Muscle weakness (generalized): Secondary | ICD-10-CM

## 2021-01-27 NOTE — Therapy (Signed)
OUTPATIENT PHYSICAL THERAPY TREATMENT NOTE   Patient Name: Sean Sutton MRN: [id number] DOB:22-Oct-1960, 61 y.o., male Today's Date: 01/27/2021  [facility] REFERRING PROVIDER: [facility]   PT End of Session - 01/27/21 1478     Visit Number 6    Number of Visits 15    Date for PT Re-Evaluation 02/05/21    Authorization Type [facility]    Authorization Time Period 01/08/21-03/05/21    Progress Note Due on Visit 10             Past Medical History:  Diagnosis Date   Diabetes mellitus without complication (HCC)    Hypertension    History reviewed. No pertinent surgical history. There are no problems to display for this patient.   REFERRING DIAG: L meniscus repair   THERAPY DIAG: L knee meniscus tear   PERTINENT HISTORY: see eval  PRECAUTIONS: none  SUBJECTIVE: Went for a longer more intense walk over the weekend and noted some mild increase in swelling and soreness which resolved with ice.  PAIN:  Are you having pain? No VAS scale: 0/10 Pain location: L knee Pain orientation: Left  PAIN TYPE: stiff Pain description: intermittent      OBJECTIVE:    DIAGNOSTIC FINDINGS: N/A   PATIENT SURVEYS:  FOTO 41       MUSCLE LENGTH: Hamstrings: Right 80 deg; Left 70 deg Thomas test: negative L   POSTURE:  NT   PALPATION: TTP medial joint line L knee   LE AROM/PROM:   A/PROM Right 01/08/2021 Left 01/08/2021 Left  01/15/2021  Hip flexion [facility]   Hip extension       Hip abduction       Hip adduction       Hip internal rotation       Hip external rotation       Knee flexion 134 104 125  Knee extension 9 -2/0   Ankle dorsiflexion       Ankle plantarflexion       Ankle inversion       Ankle eversion        (Blank rows = not tested)   LE MMT:   MMT Right 01/08/2021 Left 01/08/2021  Hip flexion 5 5  Hip extension 5 5  Hip abduction      Hip adduction      Hip internal rotation      Hip external rotation      Knee flexion  5 4  Knee extension 5 4  Ankle dorsiflexion      Ankle plantarflexion      Ankle inversion      Ankle eversion       (Blank rows = not tested)   LOWER EXTREMITY SPECIAL TESTS:  Hip special tests: Maisie Fus test: negative Knee special tests: Patellafemoral apprehension test: negative and Patellafemoral grind test: negative   FUNCTIONAL TESTS:  5 times sit to stand: 16s with BUE support   GAIT: Distance walked: 241ft Assistive device utilized: None Level of assistance: Modified independence Comments: decreased WB LLE     TODAY'S TREATMENT: [facility] Adult PT Treatment:                                                DATE: 01/27/21 Therapeutic Exercise: Nustep L6, arms 8, seat 6, 8 min L ITB stretch 30s x3 Heelslides  with strap x30, initially 125d flexion Sidelie L hip abduction x30 Single leg bridge L 30x Sidelie clamshell 5# x30 SLR 5# x30  Therapeutic Activity: Step ups 4" x30 Lateral step up 4" x30 Leg press 55# x30  [facility] Adult PT Treatment:                                                DATE: 01/22/21 Therapeutic Exercise: Nustep L4, arms 8, seat 6, 8 min L ITB stretch 30s x3 Heelslides with strap x30, initially 130d flexion, 132d at end of task, 142d R Sidelie L hip abduction x30 Single leg bridge L 30x SAQs 7 1/2# 30x  Therapeutic Activity: Step ups 4" x30 Lateral step up 4" x30 Leg press 45# x30 5x STS from mat 24s  [facility] Adult PT Treatment:                                                DATE: 01/20/21 Therapeutic Exercise: Nustep L4, arms 8, 8 min SAQs 5# x30 Bridge L only, 15x2 Heelslides with strap x 30- ROM 132 After including posterior tibial glides Neuromuscular re-ed: Posterior tibial glides with heel slides McConnel tape medial glide to be worn 24 hrs, discussed precautions     [facility] Adult PT Treatment:                                                DATE: 01/15/21 Therapeutic Exercise: Nustep L5, arms 9, 6 min Standing knee flexion x 10 Gastroc stretch  2 x 30 sec each Tandem stance 30 sec -added head turns for challenge Standing ITB stretch bilat x 30 sec  Seated hamstring curls red band x 20 L LAQs with ball squeeze 30x L hamstring stretch 30s x3, supine with strap L ITB stretch supine with strap  Heelslides with strap x 30- ROM 125 After SLR x30  Manual Therapy: Medial patellar glides      [facility] Adult PT Treatment:                                                DATE: 01/13/21 Therapeutic Exercise: Nustep L2, arms 8, 8 min. Heelslides with strap x 30 SLR x30 L hamstring stretch 30s x3 SAQs x30 Sidelie abduction x30 Bridge with ball squeeze Hooklie clams, yellow band, 30x LAQs with ball squeeze 30x Manual Therapy: Medial patellar glides     PATIENT EDUCATION:  Education details: continue current HEP Person educated: Patient Education method: Explanation, Demonstration, and Handouts Education comprehension: verbalized understanding and returned demonstration     HOME EXERCISE PROGRAM: Access Code: [email or web address] URL: [email or web address] Date: 01/08/2021 Prepared by: Gustavus Bryant   Exercises Sidelying Hip Abduction - 2 x daily - 7 x weekly - 1 sets - 30 reps Supine Straight Leg Raises - 2 x daily - 7 x weekly - 1 sets - 30 reps Long Sitting Quad Set - 2 x daily - 7 x weekly - 1 sets - 30 reps  Supine Heel Slide with Strap - 2 x daily - 7 x weekly - 1 sets - 30 reps     ASSESSMENT:   CLINICAL IMPRESSION: Continued reports of minimal L knee pain with continued limitations in flexion and medial patellar mobility due to VMO contraction latency, continued with TKE strengthening.  FOTO score 56 out of projected 58 REHAB POTENTIAL: Good   CLINICAL DECISION MAKING: Stable/uncomplicated   EVALUATION COMPLEXITY: Low     GOALS: Goals reviewed with patient? Yes   SHORT TERM GOALS:   STG Name Target Date Goal status  1 Patient to demonstrate independence in HEP  Baseline: Access Code: [email or web address] U    02/05/2021 INITIAL  2 Complete  STS w/o UE support Baseline: Requires BUE support 02/05/2021 INITIAL  3 110d L knee flexion AROM Baseline:104d AROM in flexion 02/05/2021 INITIAL  4 Decrease pain to 2/10 Baseline:4/10 02/05/2021 INITIAL    LONG TERM GOALS:    LTG Name Target Date Goal status  1 135d AROM flexion in L knee Baseline:104d flexion 03/05/2021 INITIAL  2 FOTO score of 58 Baseline:41 03/05/2021 INITIAL  3 5/5 L knee extension Baseline: 4/5 03/05/2021 INITIAL  4 Patient to perform 5x STS in 15s w/o UE support Baseline: 03/05/2021 INITIAL    PLAN: PT FREQUENCY: 2x/week   PT DURATION: 8 weeks   PLANNED INTERVENTIONS: Therapeutic exercises, Therapeutic activity, Neuro Muscular re-education, Balance training, Gait training, Patient/Family education, Joint mobilization, Stair training, and DME instructions   PLAN FOR NEXT SESSION: Step downs, TKEs, VMO strengthening   Learta CoddingJeff Treasure Ochs PT 01/27/21 8:38 AM Phone: [phone] Fax: [phone]

## 2021-01-29 ENCOUNTER — Ambulatory Visit: Payer: No Typology Code available for payment source

## 2021-01-29 ENCOUNTER — Other Ambulatory Visit: Payer: Self-pay

## 2021-01-29 DIAGNOSIS — M25562 Pain in left knee: Secondary | ICD-10-CM | POA: Diagnosis not present

## 2021-01-29 DIAGNOSIS — M6281 Muscle weakness (generalized): Secondary | ICD-10-CM

## 2021-01-29 DIAGNOSIS — R2681 Unsteadiness on feet: Secondary | ICD-10-CM

## 2021-01-29 NOTE — Therapy (Signed)
OUTPATIENT PHYSICAL THERAPY TREATMENT NOTE   Patient Name: Sean Sutton MRN: [id number] DOB:[birth year], 61 y.o., male Today's Date: 01/29/2021  PCP: [facility] REFERRING PROVIDER: Joelene Millin,*   PT End of Session - 01/29/21 [id number]     Visit Number 7    Number of Visits 15    Date for PT Re-Evaluation 02/05/21    Authorization Type [place]    Authorization Time Period 01/08/21-03/05/21    Progress Note Due on Visit 10    PT Start Time 1000    PT Stop Time 1045    PT Time Calculation (min) 45 min    Activity Tolerance Patient tolerated treatment well    Behavior During Therapy WFL for tasks assessed/performed             Past Medical History:  Diagnosis Date   Diabetes mellitus without complication ([place])    Hypertension    History reviewed. No pertinent surgical history. There are no problems to display for this patient.   REFERRING DIAG: L meniscus repair   THERAPY DIAG: L knee meniscus tear   PERTINENT HISTORY: see eval  PRECAUTIONS: none  SUBJECTIVE: No changes to note, previous increase is symptoms have resolved.  PAIN:  Are you having pain? No VAS scale: 0/10 Pain location: L knee Pain orientation: Left  PAIN TYPE: stiff Pain description: intermittent      OBJECTIVE:    DIAGNOSTIC FINDINGS: N/A   PATIENT SURVEYS:  FOTO 41       MUSCLE LENGTH: Hamstrings: Right 80 deg; Left 70 deg Thomas test: negative L   POSTURE:  NT   PALPATION: TTP medial joint line L knee   LE AROM/PROM:   A/PROM Right 01/08/2021 Left 01/08/2021 Left  01/15/2021  Hip flexion [facility]   Hip extension       Hip abduction       Hip adduction       Hip internal rotation       Hip external rotation       Knee flexion 134 104 125  Knee extension 9 -2/0   Ankle dorsiflexion       Ankle plantarflexion       Ankle inversion       Ankle eversion        (Blank rows = not tested)   LE MMT:   MMT Right 01/08/2021 Left 01/08/2021  Hip flexion 5  5  Hip extension 5 5  Hip abduction      Hip adduction      Hip internal rotation      Hip external rotation      Knee flexion 5 4  Knee extension 5 4  Ankle dorsiflexion      Ankle plantarflexion      Ankle inversion      Ankle eversion       (Blank rows = not tested)   LOWER EXTREMITY SPECIAL TESTS:  Hip special tests: Marcello Moores test: negative Knee special tests: Patellafemoral apprehension test: negative and Patellafemoral grind test: negative   FUNCTIONAL TESTS:  5 times sit to stand: 16s with BUE support   GAIT: Distance walked: 239ft Assistive device utilized: None Level of assistance: Modified independence Comments: decreased WB LLE     TODAY'S TREATMENT:  [facility] Adult PT Treatment:  DATE: 01/29/21 Therapeutic Exercise: Recumbent bike 8 min L150 L ITB stretch 30s x3 Heelslides with strap x30, 132d flexion Sidelie L hip abduction x30 Single leg bridge L 30x Sidelie clamshell 7 1/2# 2x15 SLR 7 1/2# x15  Therapeutic Activity: Step ups 4" x30 Lateral step up 4" x30 Leg press 60# x30 Step downs 2" x15  [facility] Adult PT Treatment:                                                DATE: 01/27/21 Therapeutic Exercise: Nustep L6, arms 8, seat 6, 8 min L ITB stretch 30s x3 Heelslides with strap x30, initially 125d flexion Sidelie L hip abduction x30 Single leg bridge L 30x Sidelie clamshell 5# x30 SLR 5# x30  Therapeutic Activity: Step ups 4" x30 Lateral step up 4" x30 Leg press 55# x30  [facility] Adult PT Treatment:                                                DATE: 01/22/21 Therapeutic Exercise: Nustep L4, arms 8, seat 6, 8 min L ITB stretch 30s x3 Heelslides with strap x30, initially 130d flexion, 132d at end of task, 142d R Sidelie L hip abduction x30 Single leg bridge L 30x SAQs 7 1/2# 30x  Therapeutic Activity: Step ups 4" x30 Lateral step up 4" x30 Leg press 45# x30 5x STS from mat 24s  [facility] Adult PT Treatment:                                                 DATE: 01/20/21 Therapeutic Exercise: Nustep L4, arms 8, 8 min SAQs 5# x30 Bridge L only, 15x2 Heelslides with strap x 30- ROM 132 After including posterior tibial glides Neuromuscular re-ed: Posterior tibial glides with heel slides McConnel tape medial glide to be worn 24 hrs, discussed precautions        PATIENT EDUCATION:  Education details: continue current HEP Person educated: Patient Education method: Explanation, Demonstration, and Handouts Education comprehension: verbalized understanding and returned demonstration     HOME EXERCISE PROGRAM: Access Code: [email or web address] URL: [email or web address] Date: 01/08/2021 Prepared by: Sharlynn Oliphant   Exercises Sidelying Hip Abduction - 2 x daily - 7 x weekly - 1 sets - 30 reps Supine Straight Leg Raises - 2 x daily - 7 x weekly - 1 sets - 30 reps Long Sitting Quad Set - 2 x daily - 7 x weekly - 1 sets - 30 reps Supine Heel Slide with Strap - 2 x daily - 7 x weekly - 1 sets - 30 reps     ASSESSMENT:   CLINICAL IMPRESSION: Advanced to eccentric tasks and step down which were difficult by pain free.  Increased weight and resistance a noted to challenge strengthening tasks, no increase in symptoms noted.   REHAB POTENTIAL: Good   CLINICAL DECISION MAKING: Stable/uncomplicated   EVALUATION COMPLEXITY: Low     GOALS: Goals reviewed with patient? Yes   SHORT TERM GOALS:   STG Name Target Date Goal status  1 Patient to demonstrate independence in HEP  Baseline: Access Code:  [id number] [email or web address]   02/05/2021 INITIAL  2 Complete  STS w/o UE support Baseline: Requires BUE support 02/05/2021 INITIAL  3 110d L knee flexion AROM Baseline:104d AROM in flexion 02/05/2021 INITIAL  4 Decrease pain to 2/10 Baseline:4/10 02/05/2021 INITIAL    LONG TERM GOALS:    LTG Name Target Date Goal status  1 135d AROM flexion in L knee Baseline:104d flexion 03/05/2021 INITIAL  2 FOTO score of  58 Baseline:41 03/05/2021 INITIAL  3 5/5 L knee extension Baseline: 4/5 03/05/2021 INITIAL  4 Patient to perform 5x STS in 15s w/o UE support Baseline: 03/05/2021 INITIAL    PLAN: PT FREQUENCY: 2x/week   PT DURATION: 8 weeks   PLANNED INTERVENTIONS: Therapeutic exercises, Therapeutic activity, Neuro Muscular re-education, Balance training, Gait training, Patient/Family education, Joint mobilization, Stair training, and DME instructions   PLAN FOR NEXT SESSION: Step downs, TKEs, VMO strengthening   Leroy Sea PT 01/29/21 9:57 AM Phone: [phone] Fax: [phone]

## 2021-02-03 ENCOUNTER — Ambulatory Visit: Payer: No Typology Code available for payment source

## 2021-02-03 ENCOUNTER — Other Ambulatory Visit: Payer: Self-pay

## 2021-02-03 DIAGNOSIS — M25562 Pain in left knee: Secondary | ICD-10-CM | POA: Diagnosis not present

## 2021-02-03 DIAGNOSIS — M6281 Muscle weakness (generalized): Secondary | ICD-10-CM

## 2021-02-03 DIAGNOSIS — R2681 Unsteadiness on feet: Secondary | ICD-10-CM

## 2021-02-03 NOTE — Therapy (Signed)
OUTPATIENT PHYSICAL THERAPY TREATMENT NOTE   Patient Name: Sean Sutton MRN: [id number] DOB:08-Feb-1960, 61 y.o., male Today's Date: 02/03/2021  PCP: [facility] REFERRING PROVIDER: [facility]   PT End of Session - 02/03/21 4650     Visit Number 8    Number of Visits 15    Date for PT Re-Evaluation 02/05/21    Authorization Type VA    Authorization Time Period 01/08/21-03/05/21    Progress Note Due on Visit 10    PT Start Time 0830    PT Stop Time 0915    PT Time Calculation (min) 45 min    Activity Tolerance Patient tolerated treatment well    Behavior During Therapy [facility] for tasks assessed/performed             Past Medical History:  Diagnosis Date   Diabetes mellitus without complication ([place])    Hypertension    History reviewed. No pertinent surgical history. There are no problems to display for this patient.   REFERRING DIAG: L meniscus repair   THERAPY DIAG: L knee meniscus tear   PERTINENT HISTORY: see eval  PRECAUTIONS: none  SUBJECTIVE: Some pain and limitation when kneeling on LLE but no other restriction with ADLs.  Has yet to return to long distance ambulation mainly due to inclement weather PAIN:  Are you having pain? No VAS scale: 0/10 Pain location: L knee Pain orientation: Left  PAIN TYPE: stiff Pain description: intermittent      OBJECTIVE:    DIAGNOSTIC FINDINGS: N/A   PATIENT SURVEYS:  FOTO 41       MUSCLE LENGTH: Hamstrings: Right 80 deg; Left 70 deg Thomas test: negative L   POSTURE:  NT   PALPATION: TTP medial joint line L knee   LE AROM/PROM:   A/PROM Right 01/08/2021 Left 01/08/2021 Left  01/15/2021  Hip flexion [facility]   Hip extension       Hip abduction       Hip adduction       Hip internal rotation       Hip external rotation       Knee flexion 134 104 125  Knee extension 9 -2/0   Ankle dorsiflexion       Ankle plantarflexion       Ankle inversion       Ankle eversion         (Blank rows = not tested)   LE MMT:   MMT Right 01/08/2021 Left 01/08/2021  Hip flexion 5 5  Hip extension 5 5  Hip abduction      Hip adduction      Hip internal rotation      Hip external rotation      Knee flexion 5 4  Knee extension 5 4  Ankle dorsiflexion      Ankle plantarflexion      Ankle inversion      Ankle eversion       (Blank rows = not tested)   LOWER EXTREMITY SPECIAL TESTS:  Hip special tests: Marcello Moores test: negative Knee special tests: Patellafemoral apprehension test: negative and Patellafemoral grind test: negative   FUNCTIONAL TESTS:  5 times sit to stand: 16s with BUE support   GAIT: Distance walked: 229f Assistive device utilized: None Level of assistance: Modified independence Comments: decreased WB LLE     TODAY'S TREATMENT: [facility] Adult PT Treatment:  DATE: 02/03/21 Therapeutic Exercise: Recumbent bike 8 min L 2 QSs on heel block, 30x 3s hold L ITB stretch 30s x3 Sidelie L hip abduction x30 2# Sidelie clamshell 7 1/2# 2x15 SLR 7 1/2# x20  Therapeutic Activity: Step ups 6" x15 Lateral step up 6" x15 Step downs 2" x20 TKE RTB, 3 way pull 15x each  [facility] Adult PT Treatment:                                                DATE: 01/29/21 Therapeutic Exercise: Recumbent bike 8 min L1 L ITB stretch 30s x3 Heelslides with strap x30, 132d flexion Sidelie L hip abduction x30 Single leg bridge L 30x Sidelie clamshell 7 1/2# 2x15 SLR 7 1/2# x15  Therapeutic Activity: Step ups 4" x30 Lateral step up 4" x30 Leg press 60# x30 Step downs 2" x15  [facility] Adult PT Treatment:                                                DATE: 01/27/21 Therapeutic Exercise: Nustep L6, arms 8, seat 6, 8 min L ITB stretch 30s x3 Heelslides with strap x30, initially 125d flexion Sidelie L hip abduction x30 Single leg bridge L 30x Sidelie clamshell 5# x30 SLR 5# x30  Therapeutic Activity: Step ups 4" x30 Lateral step up 4"  x30 Leg press 55# x30       PATIENT EDUCATION:  Education details: continue current HEP Person educated: Patient Education method: Explanation, Demonstration, and Handouts Education comprehension: verbalized understanding and returned demonstration     HOME EXERCISE PROGRAM: Access Code: [email or web address] URL: [email or web address] Date: 01/08/2021 Prepared by: Sharlynn Oliphant   Exercises Sidelying Hip Abduction - 2 x daily - 7 x weekly - 1 sets - 30 reps Supine Straight Leg Raises - 2 x daily - 7 x weekly - 1 sets - 30 reps Long Sitting Quad Set - 2 x daily - 7 x weekly - 1 sets - 30 reps Supine Heel Slide with Strap - 2 x daily - 7 x weekly - 1 sets - 30 reps     ASSESSMENT:   CLINICAL IMPRESSION: Continues to function with little to no pain, has yet to return to long distance walking.  Todays session focused on TKE strength and [facility] functional tasks/stepping.  Continued strength deficits in TKE and abduction.  Some retropatellar pain present with stepping tasks.  REHAB POTENTIAL: Good   CLINICAL DECISION MAKING: Stable/uncomplicated   EVALUATION COMPLEXITY: Low     GOALS: Goals reviewed with patient? Yes   SHORT TERM GOALS:   STG Name Target Date Goal status  1 Patient to demonstrate independence in HEP  Baseline: Access Code: [email or web address]   02/05/2021 INITIAL  2 Complete  STS w/o UE support Baseline: Requires BUE support 02/05/2021 INITIAL  3 110d L knee flexion AROM Baseline:104d AROM in flexion 02/05/2021 132d flexion, goal met  4 Decrease pain to 2/10 Baseline:4/10 02/05/2021 INITIAL    LONG TERM GOALS:    LTG Name Target Date Goal status  1 135d AROM flexion in L knee Baseline:104d flexion 03/05/2021 INITIAL  2 FOTO score of 58 Baseline:41 03/05/2021 INITIAL  3 5/5 L knee extension Baseline: 4/5 03/05/2021 INITIAL  4 Patient  to perform 5x STS in 15s w/o UE support Baseline: 03/05/2021 INITIAL    PLAN: PT FREQUENCY: 2x/week   PT DURATION: 8 weeks    PLANNED INTERVENTIONS: Therapeutic exercises, Therapeutic activity, Neuro Muscular re-education, Balance training, Gait training, Patient/Family education, Joint mobilization, Stair training, and DME instructions   PLAN FOR NEXT SESSION: Step downs, TKEs, VMO strengthening, taping?   Leroy Sea PT 02/03/21 8:28 AM Phone: [phone] Fax: [phone]

## 2021-02-05 ENCOUNTER — Other Ambulatory Visit: Payer: Self-pay

## 2021-02-05 ENCOUNTER — Ambulatory Visit: Payer: No Typology Code available for payment source | Attending: Orthopedic Surgery

## 2021-02-05 DIAGNOSIS — M25562 Pain in left knee: Secondary | ICD-10-CM | POA: Diagnosis present

## 2021-02-05 DIAGNOSIS — R2681 Unsteadiness on feet: Secondary | ICD-10-CM | POA: Insufficient documentation

## 2021-02-05 DIAGNOSIS — M6281 Muscle weakness (generalized): Secondary | ICD-10-CM | POA: Diagnosis present

## 2021-02-05 NOTE — Therapy (Signed)
OUTPATIENT PHYSICAL THERAPY TREATMENT NOTE   Patient Name: Sean Sutton MRN: [id number] DOB:1960-05-17, 61 y.o., male Today's Date: 02/05/2021  PCP: [facility] REFERRING PROVIDER: Joelene Millin,*   PT End of Session - 02/05/21 1001     Visit Number 9    Number of Visits 15    Date for PT Re-Evaluation 02/05/21    Authorization Type VA    Authorization Time Period 01/08/21-03/05/21    Progress Note Due on Visit 10    PT Start Time 1000    PT Stop Time 1045    PT Time Calculation (min) 45 min    Activity Tolerance Patient tolerated treatment well    Behavior During Therapy WFL for tasks assessed/performed             Past Medical History:  Diagnosis Date   Diabetes mellitus without complication ([place])    Hypertension    History reviewed. No pertinent surgical history. There are no problems to display for this patient.   REFERRING DIAG: L meniscus repair   THERAPY DIAG: L knee meniscus tear   PERTINENT HISTORY: see eval  PRECAUTIONS: none  SUBJECTIVE: No knee pain reported today.  Did not experience any retropatellar pain following stepping tasks at last visit PAIN:  Are you having pain? No VAS scale: 0/10 Pain location: L knee Pain orientation: Left  PAIN TYPE: stiff Pain description: intermittent      OBJECTIVE:    DIAGNOSTIC FINDINGS: N/A   PATIENT SURVEYS:  FOTO 41       MUSCLE LENGTH: Hamstrings: Right 80 deg; Left 70 deg Thomas test: negative L   POSTURE:  NT   PALPATION: TTP medial joint line L knee   LE AROM/PROM:   A/PROM Right 01/08/2021 Left 01/08/2021 Left  01/15/2021  Hip flexion [facility]   Hip extension       Hip abduction       Hip adduction       Hip internal rotation       Hip external rotation       Knee flexion 134 104 125  Knee extension 9 -2/0   Ankle dorsiflexion       Ankle plantarflexion       Ankle inversion       Ankle eversion        (Blank rows = not tested)   LE MMT:   MMT  Right 01/08/2021 Left 01/08/2021  Hip flexion 5 5  Hip extension 5 5  Hip abduction      Hip adduction      Hip internal rotation      Hip external rotation      Knee flexion 5 4  Knee extension 5 4  Ankle dorsiflexion      Ankle plantarflexion      Ankle inversion      Ankle eversion       (Blank rows = not tested)   LOWER EXTREMITY SPECIAL TESTS:  Hip special tests: Marcello Moores test: negative Knee special tests: Patellafemoral apprehension test: negative and Patellafemoral grind test: negative   FUNCTIONAL TESTS:  5 times sit to stand: 16s with BUE support   GAIT: Distance walked: 220f Assistive device utilized: None Level of assistance: Modified independence Comments: decreased WB LLE     TODAY'S TREATMENT: [facility] Adult PT Treatment:  DATE: 02/05/21 Therapeutic Exercise: Retrowalk 0.8 mph 10% grade x8 min QSs on heel block, 30x 3s hold L ITB stretch 30s x3 SAQs 7 1/2# x30 Sidelie clamshell 7 1/2# x30 SLR 7 1/2# x20  Therapeutic Activity: Step ups 6" x20 Lateral step up 6" x20 Step downs 2" x20 TKE GTB, 3 way pull 15x each  [facility] Adult PT Treatment:                                                DATE: 02/03/21 Therapeutic Exercise: Recumbent bike 8 min L 2 QSs on heel block, 30x 3s hold L ITB stretch 30s x3 Sidelie L hip abduction x30 2# Sidelie clamshell 7 1/2# 2x15 SLR 7 1/2# x20  Therapeutic Activity: Step ups 6" x15 Lateral step up 6" x15 Step downs 2" x20 TKE RTB, 3 way pull 15x each  [facility] Adult PT Treatment:                                                DATE: 01/29/21 Therapeutic Exercise: Recumbent bike 8 min L1 L ITB stretch 30s x3 Heelslides with strap x30, 132d flexion Sidelie L hip abduction x30 Single leg bridge L 30x Sidelie clamshell 7 1/2# 2x15 SLR 7 1/2# x15  Therapeutic Activity: Step ups 4" x30 Lateral step up 4" x30 Leg press 60# x30 Step downs 2" x15  [facility] Adult PT Treatment:                                                 DATE: 01/27/21 Therapeutic Exercise: Nustep L6, arms 8, seat 6, 8 min L ITB stretch 30s x3 Heelslides with strap x30, initially 125d flexion Sidelie L hip abduction x30 Single leg bridge L 30x Sidelie clamshell 5# x30 SLR 5# x30  Therapeutic Activity: Step ups 4" x30 Lateral step up 4" x30 Leg press 55# x30       PATIENT EDUCATION:  Education details: continue current HEP Person educated: Patient Education method: Explanation, Demonstration, and Handouts Education comprehension: verbalized understanding and returned demonstration     HOME EXERCISE PROGRAM: Access Code: [email or web address] URL: [email or web address] Date: 01/08/2021 Prepared by: Sharlynn Oliphant   Exercises Sidelying Hip Abduction - 2 x daily - 7 x weekly - 1 sets - 30 reps Supine Straight Leg Raises - 2 x daily - 7 x weekly - 1 sets - 30 reps Long Sitting Quad Set - 2 x daily - 7 x weekly - 1 sets - 30 reps Supine Heel Slide with Strap - 2 x daily - 7 x weekly - 1 sets - 30 reps     ASSESSMENT:   CLINICAL IMPRESSION: Patient able to tolerate last session eccentric step downs w/o any retropatellar irritation.  Still showing signs of strength deficits in TKE especially last 10d.  Added retrowalk to focus on TKE during a functional task.  Increased difficulty/weight and reps as noted.  REHAB POTENTIAL: Good   CLINICAL DECISION MAKING: Stable/uncomplicated   EVALUATION COMPLEXITY: Low     GOALS: Goals reviewed with patient? Yes   SHORT TERM GOALS:   STG  Name Target Date Goal status  1 Patient to demonstrate independence in HEP  Baseline: Access Code: [email or web address]   02/05/2021 INITIAL  2 Complete  STS w/o UE support Baseline: Requires BUE support 02/05/2021 INITIAL  3 110d L knee flexion AROM Baseline:104d AROM in flexion 02/05/2021 132d flexion, goal met  4 Decrease pain to 2/10 Baseline:4/10 02/05/2021 INITIAL    LONG TERM GOALS:    LTG Name Target Date Goal  status  1 135d AROM flexion in L knee Baseline:104d flexion 03/05/2021 INITIAL  2 FOTO score of 58 Baseline:41 03/05/2021 INITIAL  3 5/5 L knee extension Baseline: 4/5 03/05/2021 INITIAL  4 Patient to perform 5x STS in 15s w/o UE support Baseline: 03/05/2021 INITIAL    PLAN: PT FREQUENCY: 2x/week   PT DURATION: 8 weeks   PLANNED INTERVENTIONS: Therapeutic exercises, Therapeutic activity, Neuro Muscular re-education, Balance training, Gait training, Patient/Family education, Joint mobilization, Stair training, and DME instructions   PLAN FOR NEXT SESSION: Step downs, TKEs, VMO strengthening, taping?   Leroy Sea PT 02/05/21 10:06 AM Phone: [phone] Fax: [phone]

## 2021-02-10 ENCOUNTER — Other Ambulatory Visit: Payer: Self-pay

## 2021-02-10 ENCOUNTER — Ambulatory Visit: Payer: No Typology Code available for payment source

## 2021-02-10 DIAGNOSIS — M25562 Pain in left knee: Secondary | ICD-10-CM

## 2021-02-10 DIAGNOSIS — R2681 Unsteadiness on feet: Secondary | ICD-10-CM

## 2021-02-10 DIAGNOSIS — M6281 Muscle weakness (generalized): Secondary | ICD-10-CM

## 2021-02-10 NOTE — Therapy (Signed)
OUTPATIENT PHYSICAL THERAPY TREATMENT NOTE/PROGRESS NOTE   Patient Name: Sean Sutton MRN: [id number] DOB:1960/09/04, 61 y.o., male Today's Date: 02/10/2021  PCP: [facility] REFERRING PROVIDER: [facility]  Physical Therapy Progress Note   Dates of Reporting Period:01/08/21-02/10/21  See Note below for Objective Data and Assessment of Progress/Goals.     PT End of Session - 02/10/21 0832     Visit Number 10    Number of Visits 15    Date for PT Re-Evaluation 02/05/21    Authorization Type [place]    Authorization Time Period 01/08/21-03/05/21    Progress Note Due on Visit 10    PT Start Time 0830    PT Stop Time 0915    PT Time Calculation (min) 45 min    Activity Tolerance Patient tolerated treatment well    Behavior During Therapy WFL for tasks assessed/performed             Past Medical History:  Diagnosis Date   Diabetes mellitus without complication ([place])    Hypertension    History reviewed. No pertinent surgical history. There are no problems to display for this patient.   REFERRING DIAG: L meniscus repair   THERAPY DIAG: L knee meniscus tear   PERTINENT HISTORY: see eval  PRECAUTIONS: none  SUBJECTIVE: Has been able to return to longer distance ambulation w/o any increase in L knee pain, still has pain when kneeling on L knee PAIN:  Are you having pain? No VAS scale: 0/10 Pain location: L knee Pain orientation: Left  PAIN TYPE: stiff Pain description: intermittent      OBJECTIVE:    DIAGNOSTIC FINDINGS: N/A   PATIENT SURVEYS:  FOTO 41       MUSCLE LENGTH: Hamstrings: Right 80 deg; Left 70 deg Thomas test: negative L   POSTURE:  NT   PALPATION: TTP medial joint line L knee   LE AROM/PROM:   A/PROM Right 01/08/2021 Left 01/08/2021 Left  01/15/2021  Hip flexion [facility]   Hip extension       Hip abduction       Hip adduction       Hip internal rotation       Hip external rotation       Knee flexion 134 104  125  Knee extension 9 -2/0   Ankle dorsiflexion       Ankle plantarflexion       Ankle inversion       Ankle eversion        (Blank rows = not tested)   LE MMT:   MMT Right 01/08/2021 Left 01/08/2021  Hip flexion 5 5  Hip extension 5 5  Hip abduction      Hip adduction      Hip internal rotation      Hip external rotation      Knee flexion 5 4  Knee extension 5 4  Ankle dorsiflexion      Ankle plantarflexion      Ankle inversion      Ankle eversion       (Blank rows = not tested)   LOWER EXTREMITY SPECIAL TESTS:  Hip special tests: Marcello Moores test: negative Knee special tests: Patellafemoral apprehension test: negative and Patellafemoral grind test: negative   FUNCTIONAL TESTS:  5 times sit to stand: 16s with BUE support   GAIT: Distance walked: 256f Assistive device utilized: None Level of assistance: Modified independence Comments: decreased WB LLE     [facility]                                                DATE: 02/10/21 Therapeutic Exercise: Retrowalk 0.8 mph 10% grade x8 min QSs on heel block, 30x 3s hold L ITB stretch 30s x3 SAQs 7 1/2# x30 Sidelie clamshell 7 1/2# x30 SLR 7 1/2# x30  Therapeutic Activity: TKE Blue TB, 3 way pull 15x each  [facility] Adult PT Treatment:                                                DATE: 02/05/21 Therapeutic Exercise: Retrowalk 0.8 mph 10% grade x8 min QSs on heel block, 30x 3s hold L ITB stretch 30s x3 SAQs 7 1/2# x30 Sidelie clamshell 7 1/2# x30 SLR 7 1/2# x20  Therapeutic Activity: Step ups 6" x20 Lateral step up 6" x20 Step downs 2" x20 TKE GTB, 3 way pull 15x each  [facility] Adult PT Treatment:                                                DATE: 02/03/21 Therapeutic Exercise: Recumbent bike 8 min L 2 QSs on heel block, 30x 3s hold L ITB stretch 30s x3 Sidelie L hip abduction x30 2# Sidelie clamshell 7 1/2# 2x15 SLR 7 1/2# x20  Therapeutic Activity: Step ups 6" x15 Lateral step up  6" x15 Step downs 2" x20 TKE RTB, 3 way pull 15x each     PATIENT EDUCATION:  Education details: continue current HEP Person educated: Patient Education method: Explanation, Demonstration, and Handouts Education comprehension: verbalized understanding and returned demonstration     HOME EXERCISE PROGRAM: Access Code: [email or web address] URL: [email or web address] Date: 01/08/2021 Prepared by: Sharlynn Oliphant   Exercises Sidelying Hip Abduction - 2 x daily - 7 x weekly - 1 sets - 30 reps Supine Straight Leg Raises - 2 x daily - 7 x weekly - 1 sets - 30 reps Long Sitting Quad Set - 2 x daily - 7 x weekly - 1 sets - 30 reps Supine Heel Slide with Strap - 2 x daily - 7 x weekly - 1 sets - 30 reps     ASSESSMENT:   CLINICAL IMPRESSION: Has been able to extend his walking distance w/o any increase in knee symptoms.  Main irritation to L knee comes from kneeling on it.  Able to add additional reps to SLRs.  All STGs met  REHAB POTENTIAL: Good   CLINICAL DECISION MAKING: Stable/uncomplicated   EVALUATION COMPLEXITY: Low     GOALS: Goals reviewed with patient? Yes   SHORT TERM GOALS:   STG Name Target Date Goal status  1 Patient to demonstrate independence in HEP  Baseline: Access Code: [email or web address]   02/05/2021 02/10/21 Goal met  2 Complete  STS w/o UE support Baseline: Requires BUE support 02/05/2021 02/10/21 Goal met  3 110d L knee flexion AROM Baseline:104d AROM in flexion 02/05/2021 132d flexion goal met  4 Decrease pain to 2/10 Baseline:4/10 02/05/2021 02/10/21 Goal met    LONG TERM GOALS:    LTG Name Target  Date Goal status  1 135d AROM flexion in L knee Baseline:104d flexion 03/05/2021 02/10/21 132d flexion  2 FOTO score of 58 Baseline:41 03/05/2021 INITIAL  3 5/5 L knee extension Baseline: 4/5 03/05/2021 INITIAL  4 Patient to perform 5x STS in 15s w/o UE support Baseline: 03/05/2021 INITIAL    PLAN: PT FREQUENCY: 2x/week   PT DURATION: 8 weeks   PLANNED INTERVENTIONS:  Therapeutic exercises, Therapeutic activity, Neuro Muscular re-education, Balance training, Gait training, Patient/Family education, Joint mobilization, Stair training, and DME instructions   PLAN FOR NEXT SESSION: Step downs, TKEs, VMO strengthening, functional tasks as tolerated in CKC, cable walking    [place] Wojciech Willetts PT 02/10/21 8:34 AM Phone: [phone] Fax: [phone]

## 2021-02-12 ENCOUNTER — Ambulatory Visit: Payer: No Typology Code available for payment source

## 2021-02-12 ENCOUNTER — Other Ambulatory Visit: Payer: Self-pay

## 2021-02-12 DIAGNOSIS — R2681 Unsteadiness on feet: Secondary | ICD-10-CM | POA: Diagnosis not present

## 2021-02-12 DIAGNOSIS — M25562 Pain in left knee: Secondary | ICD-10-CM

## 2021-02-12 DIAGNOSIS — M6281 Muscle weakness (generalized): Secondary | ICD-10-CM

## 2021-02-12 NOTE — Therapy (Signed)
OUTPATIENT PHYSICAL THERAPY TREATMENT NOTE/PROGRESS NOTE   Patient Name: Sean Sutton MRN: [id number] DOB:25-Apr-1960, 61 y.o., male Today's Date: 02/12/2021  PCP: [facility] REFERRING PROVIDER: Joelene Millin,*    PT End of Session - 02/12/21 1505     Visit Number 11    Number of Visits 15    Date for PT Re-Evaluation 02/05/21    Authorization Type VA    Authorization Time Period 01/08/21-03/05/21    Progress Note Due on Visit 10    PT Start Time 1000    PT Stop Time 1045    PT Time Calculation (min) 45 min    Activity Tolerance Patient tolerated treatment well    Behavior During Therapy WFL for tasks assessed/performed             Past Medical History:  Diagnosis Date   Diabetes mellitus without complication ([place])    Hypertension    History reviewed. No pertinent surgical history. There are no problems to display for this patient.   REFERRING DIAG: L meniscus repair   THERAPY DIAG: L knee meniscus tear   PERTINENT HISTORY: see eval  PRECAUTIONS: none  SUBJECTIVE: No sharp pain to note, main issue is standing in one place for prolonged periods PAIN:  Are you having pain? No VAS scale: 0/10 Pain location: L knee Pain orientation: Left  PAIN TYPE: stiff Pain description: intermittent      OBJECTIVE:    DIAGNOSTIC FINDINGS: N/A   PATIENT SURVEYS:  FOTO 41       MUSCLE LENGTH: Hamstrings: Right 80 deg; Left 70 deg Thomas test: negative L   POSTURE:  NT   PALPATION: TTP medial joint line L knee   LE AROM/PROM:   A/PROM Right 01/08/2021 Left 01/08/2021 Left  01/15/2021  Hip flexion [facility]   Hip extension       Hip abduction       Hip adduction       Hip internal rotation       Hip external rotation       Knee flexion 134 104 125  Knee extension 9 -2/0   Ankle dorsiflexion       Ankle plantarflexion       Ankle inversion       Ankle eversion        (Blank rows = not tested)   LE MMT:   MMT Right 01/08/2021  Left 01/08/2021  Hip flexion 5 5  Hip extension 5 5  Hip abduction      Hip adduction      Hip internal rotation      Hip external rotation      Knee flexion 5 4  Knee extension 5 4  Ankle dorsiflexion      Ankle plantarflexion      Ankle inversion      Ankle eversion       (Blank rows = not tested)   LOWER EXTREMITY SPECIAL TESTS:  Hip special tests: Marcello Moores test: negative Knee special tests: Patellafemoral apprehension test: negative and Patellafemoral grind test: negative   FUNCTIONAL TESTS:  5 times sit to stand: 16s with BUE support   GAIT: Distance walked: 2102f Assistive device utilized: None Level of assistance: Modified independence Comments: decreased WB LLE     TODAY'S TREATMENT: [facility] Adult PT Treatment:  DATE: 02/12/21 Therapeutic Exercise: Retrowalk 1.0 mph 15% grade x8 min QSs on heel block, 30x 3s hold Seated hamstring curl, total gym, 20# x30 SLR hip circles against RTB, 60s each CW/CCW   Therapeutic Activity: Step ups 6" 2x15 Lateral step up 6" 2x15 Step downs 2" 2x15 Cablewalks, 4 way, 5x each direction, 10# pull   [facility] Adult PT Treatment:                                                DATE: 02/10/21 Therapeutic Exercise: Retrowalk 0.8 mph 10% grade x8 min QSs on heel block, 30x 3s hold L ITB stretch 30s x3 SAQs 7 1/2# x30 Sidelie clamshell 7 1/2# x30 SLR 7 1/2# x30  Therapeutic Activity: TKE Blue TB, 3 way pull 15x each  [facility] Adult PT Treatment:                                                DATE: 02/05/21 Therapeutic Exercise: Retrowalk 0.8 mph 10% grade x8 min QSs on heel block, 30x 3s hold L ITB stretch 30s x3 SAQs 7 1/2# x30 Sidelie clamshell 7 1/2# x30 SLR 7 1/2# x20  Therapeutic Activity: Step ups 6" x20 Lateral step up 6" x20 Step downs 2" x20 TKE GTB, 3 way pull 15x each    PATIENT EDUCATION:  Education details: continue current HEP Person educated: Patient Education method:  Explanation, Demonstration, and Handouts Education comprehension: verbalized understanding and returned demonstration     HOME EXERCISE PROGRAM: Access Code: [email or web address] URL: [email or web address] Date: 01/08/2021 Prepared by: Sharlynn Oliphant   Exercises Sidelying Hip Abduction - 2 x daily - 7 x weekly - 1 sets - 30 reps Supine Straight Leg Raises - 2 x daily - 7 x weekly - 1 sets - 30 reps Long Sitting Quad Set - 2 x daily - 7 x weekly - 1 sets - 30 reps Supine Heel Slide with Strap - 2 x daily - 7 x weekly - 1 sets - 30 reps     ASSESSMENT:   CLINICAL IMPRESSION: Patient able to advance to a faster pace and increased grade on treadmill w/o flare-up.  Added more functional tasks to program to challenge knee strength and function.  Attempted 8" block but pain limited performance.  REHAB POTENTIAL: Good   CLINICAL DECISION MAKING: Stable/uncomplicated   EVALUATION COMPLEXITY: Low     GOALS: Goals reviewed with patient? Yes   SHORT TERM GOALS:   STG Name Target Date Goal status  1 Patient to demonstrate independence in HEP  Baseline: Access Code: [email or web address]   02/05/2021 02/10/21 Goal met  2 Complete  STS w/o UE support Baseline: Requires BUE support 02/05/2021 02/10/21 Goal met  3 110d L knee flexion AROM Baseline:104d AROM in flexion 02/05/2021 132d flexion goal met  4 Decrease pain to 2/10 Baseline:4/10 02/05/2021 02/10/21 Goal met    LONG TERM GOALS:    LTG Name Target Date Goal status  1 135d AROM flexion in L knee Baseline:104d flexion 03/05/2021 02/10/21 132d flexion  2 FOTO score of 58 Baseline:41 03/05/2021 INITIAL  3 5/5 L knee extension Baseline: 4/5 03/05/2021 INITIAL  4 Patient to perform 5x STS in 15s w/o UE support Baseline: 03/05/2021 INITIAL  PLAN: PT FREQUENCY: 2x/week   PT DURATION: 8 weeks   PLANNED INTERVENTIONS: Therapeutic exercises, Therapeutic activity, Neuro Muscular re-education, Balance training, Gait training, Patient/Family education, Joint  mobilization, Stair training, and DME instructions   PLAN FOR NEXT SESSION: Step downs, TKEs, VMO strengthening, functional tasks as tolerated in CKC, cable walking, revisit 8" steps   Leroy Sea PT 02/12/21 9:58 AM Phone: [phone] Fax: [phone]

## 2021-02-17 ENCOUNTER — Other Ambulatory Visit: Payer: Self-pay

## 2021-02-17 ENCOUNTER — Ambulatory Visit: Payer: No Typology Code available for payment source

## 2021-02-17 DIAGNOSIS — M6281 Muscle weakness (generalized): Secondary | ICD-10-CM

## 2021-02-17 DIAGNOSIS — R2681 Unsteadiness on feet: Secondary | ICD-10-CM | POA: Diagnosis not present

## 2021-02-17 DIAGNOSIS — M25562 Pain in left knee: Secondary | ICD-10-CM

## 2021-02-17 NOTE — Therapy (Signed)
OUTPATIENT PHYSICAL THERAPY TREATMENT NOTE/PROGRESS NOTE   Patient Name: Sean Sutton MRN: [id number] DOB:November 17, 1960, 61 y.o., male Today's Date: 02/17/2021  PCP: [facility] REFERRING PROVIDER: [facility]    PT End of Session - 02/17/21 0841     Visit Number 12    Number of Visits 15    Date for PT Re-Evaluation 02/05/21    Authorization Type VA    Authorization Time Period 01/08/21-03/05/21    Progress Note Due on Visit 10    PT Start Time 0830    PT Stop Time 0915    PT Time Calculation (min) 45 min    Activity Tolerance Patient tolerated treatment well    Behavior During Therapy [facility] for tasks assessed/performed             Past Medical History:  Diagnosis Date   Diabetes mellitus without complication ([place])    Hypertension    History reviewed. No pertinent surgical history. There are no problems to display for this patient.   REFERRING DIAG: L meniscus repair   THERAPY DIAG: L knee meniscus tear   PERTINENT HISTORY: see eval  PRECAUTIONS: none  SUBJECTIVE: Knee pain "not bad".  Still notes pain when bending knee to put on L shoe PAIN:  Are you having pain? No VAS scale: 0/10 Pain location: L knee Pain orientation: Left  PAIN TYPE: stiff Pain description: intermittent      OBJECTIVE:    DIAGNOSTIC FINDINGS: N/A   PATIENT SURVEYS:  FOTO 41       MUSCLE LENGTH: Hamstrings: Right 80 deg; Left 70 deg Thomas test: negative L   POSTURE:  NT   PALPATION: TTP medial joint line L knee   LE AROM/PROM:   A/PROM Right 01/08/2021 Left 01/08/2021 Left  01/15/2021  Hip flexion [facility]   Hip extension       Hip abduction       Hip adduction       Hip internal rotation       Hip external rotation       Knee flexion 134 104 125  Knee extension 9 -2/0   Ankle dorsiflexion       Ankle plantarflexion       Ankle inversion       Ankle eversion        (Blank rows = not tested)   LE MMT:   MMT Right 01/08/2021  Left 01/08/2021  Hip flexion 5 5  Hip extension 5 5  Hip abduction      Hip adduction      Hip internal rotation      Hip external rotation      Knee flexion 5 4  Knee extension 5 4  Ankle dorsiflexion      Ankle plantarflexion      Ankle inversion      Ankle eversion       (Blank rows = not tested)   LOWER EXTREMITY SPECIAL TESTS:  Hip special tests: Marcello Moores test: negative Knee special tests: Patellafemoral apprehension test: negative and Patellafemoral grind test: negative   FUNCTIONAL TESTS:  5 times sit to stand: 16s with BUE support   GAIT: Distance walked: 227f Assistive device utilized: None Level of assistance: Modified independence Comments: decreased WB LLE     TODAY'S TREATMENT: [facility] Adult PT Treatment:  DATE: 02/18/20 Therapeutic Exercise: Retrowalk 1.2 mph 10% grade x8 min QSs on heel block, 30x 3s hold SLR L 5# x30 to assess TKE Seated hamstring curl, total gym, 25# x30 SLR hip circles against BTB, 60s each CW/CCW   Therapeutic Activity: Step ups 6" 2x15 Lateral step up 6" 2x15 Step downs 2" 2x15 Cablewalks, 4 way, 5x each direction, 13# pull  [facility] Adult PT Treatment:                                                DATE: 02/12/21 Therapeutic Exercise: Retrowalk 1.0 mph 15% grade x8 min QSs on heel block, 30x 3s hold Seated hamstring curl, total gym, 20# x30 SLR hip circles against RTB, 60s each CW/CCW    Therapeutic Activity: Step ups 6" 2x15 Lateral step up 6" 2x15 Step downs 2" 2x15 Cablewalks, 4 way, 5x each direction, 10# pull   [facility] Adult PT Treatment:                                                DATE: 02/10/21 Therapeutic Exercise: Retrowalk 0.8 mph 10% grade x8 min QSs on heel block, 30x 3s hold L ITB stretch 30s x3 SAQs 7 1/2# x30 Sidelie clamshell 7 1/2# x30 SLR 7 1/2# x30  Therapeutic Activity: TKE Blue TB, 3 way pull 15x each  [facility] Adult PT Treatment:                                                 DATE: 02/05/21 Therapeutic Exercise: Retrowalk 0.8 mph 10% grade x8 min QSs on heel block, 30x 3s hold L ITB stretch 30s x3 SAQs 7 1/2# x30 Sidelie clamshell 7 1/2# x30 SLR 7 1/2# x20  Therapeutic Activity: Step ups 6" x20 Lateral step up 6" x20 Step downs 2" x20 TKE GTB, 3 way pull 15x each    PATIENT EDUCATION:  Education details: continue current HEP Person educated: Patient Education method: Explanation, Demonstration, and Handouts Education comprehension: verbalized understanding and returned demonstration     HOME EXERCISE PROGRAM: Access Code: [email or web address] URL: [email or web address] Date: 01/08/2021 Prepared by: Sharlynn Oliphant   Exercises Sidelying Hip Abduction - 2 x daily - 7 x weekly - 1 sets - 30 reps Supine Straight Leg Raises - 2 x daily - 7 x weekly - 1 sets - 30 reps Long Sitting Quad Set - 2 x daily - 7 x weekly - 1 sets - 30 reps Supine Heel Slide with Strap - 2 x daily - 7 x weekly - 1 sets - 30 reps     ASSESSMENT:   CLINICAL IMPRESSION: Still challenged by 8" step, reverted to 6".  Has not had any pain limiting his activity but does report discomfort with cable walks during eccentric L sidestepping.  REHAB POTENTIAL: Good   CLINICAL DECISION MAKING: Stable/uncomplicated   EVALUATION COMPLEXITY: Low     GOALS: Goals reviewed with patient? Yes   SHORT TERM GOALS:   STG Name Target Date Goal status  1 Patient to demonstrate independence in HEP  Baseline: Access Code: [email or web address]   02/05/2021  02/10/21 Goal met  2 Complete  STS w/o UE support Baseline: Requires BUE support 02/05/2021 02/10/21 Goal met  3 110d L knee flexion AROM Baseline:104d AROM in flexion 02/05/2021 132d flexion goal met  4 Decrease pain to 2/10 Baseline:4/10 02/05/2021 02/10/21 Goal met    LONG TERM GOALS:    LTG Name Target Date Goal status  1 135d AROM flexion in L knee Baseline:104d flexion 03/05/2021 02/10/21 132d flexion  2 FOTO score of 58 Baseline:41  03/05/2021 INITIAL  3 5/5 L knee extension Baseline: 4/5 03/05/2021 INITIAL  4 Patient to perform 5x STS in 15s w/o UE support Baseline: 03/05/2021 INITIAL    PLAN: PT FREQUENCY: 2x/week   PT DURATION: 8 weeks   PLANNED INTERVENTIONS: Therapeutic exercises, Therapeutic activity, Neuro Muscular re-education, Balance training, Gait training, Patient/Family education, Joint mobilization, Stair training, and DME instructions   PLAN FOR NEXT SESSION: TKEs, VMO strengthening, functional tasks as tolerated in CKC, cable walking, revisit 8" steps, assess L hip mobility for shoe donning   Leroy Sea PT 02/17/21 8:42 AM Phone: [phone] Fax: [phone]

## 2021-02-19 ENCOUNTER — Ambulatory Visit: Payer: No Typology Code available for payment source

## 2021-02-19 ENCOUNTER — Other Ambulatory Visit: Payer: Self-pay

## 2021-02-19 DIAGNOSIS — R2681 Unsteadiness on feet: Secondary | ICD-10-CM | POA: Diagnosis not present

## 2021-02-19 DIAGNOSIS — M25562 Pain in left knee: Secondary | ICD-10-CM

## 2021-02-19 DIAGNOSIS — M6281 Muscle weakness (generalized): Secondary | ICD-10-CM

## 2021-02-19 NOTE — Therapy (Signed)
OUTPATIENT PHYSICAL THERAPY TREATMENT NOTE/PROGRESS NOTE   Patient Name: Sean Sutton MRN: [id number] DOB:[birth year], 61 y.o., male Today's Date: 02/19/2021  PCP: [facility] REFERRING PROVIDER: Joelene Millin,*    PT End of Session - 02/19/21 0954     Visit Number 13    Number of Visits 15    Date for PT Re-Evaluation 02/05/21    Authorization Type [place]    Authorization Time Period 01/08/21-03/05/21    Progress Note Due on Visit 10    PT Start Time 1000    PT Stop Time 1045    PT Time Calculation (min) 45 min    Activity Tolerance Patient tolerated treatment well    Behavior During Therapy WFL for tasks assessed/performed             Past Medical History:  Diagnosis Date   Diabetes mellitus without complication ([place])    Hypertension    History reviewed. No pertinent surgical history. There are no problems to display for this patient.   REFERRING DIAG: L meniscus repair   THERAPY DIAG: L knee meniscus tear  PERTINENT HISTORY: see eval  PRECAUTIONS: none  SUBJECTIVE: Rates L knee function at 80%.  Still reports issues kneeling on it. PAIN:  Are you having pain? No VAS scale: 0/10 Pain location: L knee Pain orientation: Left  PAIN TYPE: stiff Pain description: intermittent   OBJECTIVE:    DIAGNOSTIC FINDINGS: N/A   PATIENT SURVEYS:  FOTO 41   MUSCLE LENGTH: Hamstrings: Right 80 deg; Left 70 deg Thomas test: negative L   POSTURE:  NT   PALPATION: TTP medial joint line L knee   LE AROM/PROM:   A/PROM Right 01/08/2021 Left 01/08/2021 Left  01/15/2021  Hip flexion [facility]   Hip extension       Hip abduction       Hip adduction       Hip internal rotation       Hip external rotation       Knee flexion 134 104 125  Knee extension 9 -2/0   Ankle dorsiflexion       Ankle plantarflexion       Ankle inversion       Ankle eversion        (Blank rows = not tested)   LE MMT:   MMT Right 01/08/2021 Left 01/08/2021  Hip  flexion 5 5  Hip extension 5 5  Hip abduction      Hip adduction      Hip internal rotation      Hip external rotation      Knee flexion 5 4  Knee extension 5 4  Ankle dorsiflexion      Ankle plantarflexion      Ankle inversion      Ankle eversion       (Blank rows = not tested)   LOWER EXTREMITY SPECIAL TESTS:  Hip special tests: Marcello Moores test: negative Knee special tests: Patellafemoral apprehension test: negative and Patellafemoral grind test: negative   FUNCTIONAL TESTS:  5 times sit to stand: 16s with BUE support   GAIT: Distance walked: 252f Assistive device utilized: None Level of assistance: Modified independence Comments: decreased WB LLE     TODAY'S TREATMENT: [facility] Adult PT Treatment:  DATE: 02/19/21 Therapeutic Exercise: Retrowalk 1.2 mph 12% grade x8 min FABER L 2 min hold SKTC L 30s x3 hold at proximal tibia to promote flexion mobilization QSs on heel block, 30x 3s hold SLR L 5# x30 to assess TKE Seated hamstring curl, total gym, 25# x30  Therapeutic Activity: Squats with ball squeeze 15x Step ups 6" 2x15 Lateral step up 6" 2x15 Step downs 2" 2x15 Cablewalks, 4 way, 5x each direction, 17# pull   [facility] Adult PT Treatment:                                                DATE: 02/18/20 Therapeutic Exercise: Retrowalk 1.2 mph 10% grade x8 min QSs on heel block, 30x 3s hold SLR L 5# x30 to assess TKE Seated hamstring curl, total gym, 25# x30 SLR hip circles against BTB, 60s each CW/CCW   Therapeutic Activity: Step ups 6" 2x15 Lateral step up 6" 2x15 Step downs 2" 2x15 Cablewalks, 4 way, 5x each direction, 13# pull  [facility] Adult PT Treatment:                                                DATE: 02/12/21 Therapeutic Exercise: Retrowalk 1.0 mph 15% grade x8 min QSs on heel block, 30x 3s hold Seated hamstring curl, total gym, 20# x30 SLR hip circles against RTB, 60s each CW/CCW    Therapeutic Activity: Step  ups 6" 2x15 Lateral step up 6" 2x15 Step downs 2" 2x15 Cablewalks, 4 way, 5x each direction, 10# pull     PATIENT EDUCATION:  Education details: continue current HEP Person educated: Patient Education method: Explanation, Demonstration, and Handouts Education comprehension: verbalized understanding and returned demonstration     HOME EXERCISE PROGRAM: Access Code: [email or web address] URL: [email or web address] Date: 02/19/2021 Prepared by: Sharlynn Oliphant  Exercises Sidelying Hip Abduction - 2 x daily - 7 x weekly - 1 sets - 30 reps Supine Straight Leg Raises - 2 x daily - 7 x weekly - 1 sets - 30 reps Hooklying Single Knee to Chest - 2 x daily - 7 x weekly - 1 sets - 3 reps - 30s hold Supine Figure 4 Piriformis Stretch - 2 x daily - 7 x weekly - 1 sets - 3 reps - 30s hold      ASSESSMENT:   CLINICAL IMPRESSION: Patient rates knee at 80%, cannot kneel on it but overall can perform ADLs w/o limitations.  Goof TKE noted with SLR.  Assessmet of L hip finds restrictions in capsular pattern and stretches were added to HEP to address.  REHAB POTENTIAL: Good   CLINICAL DECISION MAKING: Stable/uncomplicated   EVALUATION COMPLEXITY: Low     GOALS: Goals reviewed with patient? Yes   SHORT TERM GOALS:   STG Name Target Date Goal status  1 Patient to demonstrate independence in HEP  Baseline: Access Code: [email or web address] U   02/05/2021 02/10/21 Goal met  2 Complete  STS w/o UE support Baseline: Requires BUE support 02/05/2021 02/10/21 Goal met  3 110d L knee flexion AROM Baseline:104d AROM in flexion 02/05/2021 132d flexion goal met  4 Decrease pain to 2/10 Baseline:4/10 02/05/2021 02/10/21 Goal met    LONG TERM GOALS:    LTG Name Target Date Goal  status  1 135d AROM flexion in L knee Baseline:104d flexion 03/05/2021 02/10/21 132d flexion  2 FOTO score of 58 Baseline:41; 56 at 6th visit 03/05/2021 Ongoing  3 5/5 L knee extension Baseline: 4/5 03/05/2021 INITIAL  4 Patient to perform 5x STS  in 15s w/o UE support Baseline: 03/05/2021 INITIAL    PLAN: PT FREQUENCY: 2x/week   PT DURATION: 8 weeks   PLANNED INTERVENTIONS: Therapeutic exercises, Therapeutic activity, Neuro Muscular re-education, Balance training, Gait training, Patient/Family education, Joint mobilization, Stair training, and DME instructions   PLAN FOR NEXT SESSION: TKEs, VMO strengthening, functional tasks as tolerated in CKC, cable walking, revisit 8" steps, assess L hip mobility stretches for benefit   Leroy Sea PT 02/19/21 9:56 AM Phone: [phone] Fax: [phone]

## 2021-02-24 ENCOUNTER — Other Ambulatory Visit: Payer: Self-pay

## 2021-02-24 ENCOUNTER — Ambulatory Visit: Payer: No Typology Code available for payment source

## 2021-02-24 DIAGNOSIS — M25562 Pain in left knee: Secondary | ICD-10-CM

## 2021-02-24 DIAGNOSIS — R2681 Unsteadiness on feet: Secondary | ICD-10-CM

## 2021-02-24 DIAGNOSIS — M6281 Muscle weakness (generalized): Secondary | ICD-10-CM

## 2021-02-24 NOTE — Therapy (Signed)
OUTPATIENT PHYSICAL THERAPY TREATMENT NOTE/PROGRESS NOTE   Patient Name: Sean Sutton MRN: [id number] DOB:03-16-1960, 61 y.o., male Today's Date: 02/24/2021  PCP: [facility] REFERRING PROVIDER: [facility]    PT End of Session - 02/24/21 4098     Visit Number 14    Number of Visits 15    Date for PT Re-Evaluation 02/05/21    Authorization Type VA    Authorization Time Period 01/08/21-03/05/21    Progress Note Due on Visit 10    PT Start Time 0830    PT Stop Time 0915    PT Time Calculation (min) 45 min    Activity Tolerance Patient tolerated treatment well    Behavior During Therapy [facility] for tasks assessed/performed             Past Medical History:  Diagnosis Date   Diabetes mellitus without complication ([place])    Hypertension    History reviewed. No pertinent surgical history. There are no problems to display for this patient.   REFERRING DIAG: L meniscus repair   THERAPY DIAG: L knee meniscus tear  PERTINENT HISTORY: see eval  PRECAUTIONS: none  SUBJECTIVE: No new c/o, knee did not bother him with tasks over the weekend PAIN:  Are you having pain? No VAS scale: 0/10 Pain location: L knee Pain orientation: Left  PAIN TYPE: stiff Pain description: intermittent   OBJECTIVE:    DIAGNOSTIC FINDINGS: N/A   PATIENT SURVEYS:  FOTO 41   MUSCLE LENGTH: Hamstrings: Right 80 deg; Left 70 deg Thomas test: negative L   POSTURE:  NT   PALPATION: TTP medial joint line L knee   LE AROM/PROM:   A/PROM Right 01/08/2021 Left 01/08/2021 Left  02/24/2021  Hip flexion [facility]   Hip extension       Hip abduction       Hip adduction       Hip internal rotation       Hip external rotation       Knee flexion 134 104 132  Knee extension 9 -2/0   Ankle dorsiflexion       Ankle plantarflexion       Ankle inversion       Ankle eversion        (Blank rows = not tested)   LE MMT:   MMT Right 01/08/2021 Left 01/08/2021  Hip flexion 5 5   Hip extension 5 5  Hip abduction      Hip adduction      Hip internal rotation      Hip external rotation      Knee flexion 5 4  Knee extension 5 4  Ankle dorsiflexion      Ankle plantarflexion      Ankle inversion      Ankle eversion       (Blank rows = not tested)   LOWER EXTREMITY SPECIAL TESTS:  Hip special tests: Marcello Moores test: negative Knee special tests: Patellafemoral apprehension test: negative and Patellafemoral grind test: negative   FUNCTIONAL TESTS:  5 times sit to stand: 16s with BUE support   GAIT: Distance walked: 278f Assistive device utilized: None Level of assistance: Modified independence Comments: decreased WB LLE     TODAY'S TREATMENT: [facility] Adult PT Treatment:  DATE: 02/24/21 Therapeutic Exercise: Retrowalk 1.2 mph 15% grade x8 min FABER L 2 min hold SKTC L 30s x3 hold at proximal tibia to promote flexion mobilization QSs on heel block, 30x 3s hold SLR L 5# x30 to assess TKE Seated hamstring curl, total gym, 35# x30  Therapeutic Activity: Squats with ball squeeze 20x Step ups 6" 20x Lateral step up 6" 20x Step downs 2" 20x L lunges onto knee bolster, 15x UE support as needed  [facility] Adult PT Treatment:                                                DATE: 02/19/21 Therapeutic Exercise: Retrowalk 1.2 mph 12% grade x8 min FABER L 2 min hold SKTC L 30s x3 hold at proximal tibia to promote flexion mobilization QSs on heel block, 30x 3s hold SLR L 5# x30 to assess TKE Seated hamstring curl, total gym, 25# x30  Therapeutic Activity: Squats with ball squeeze 15x Step ups 6" 2x15 Lateral step up 6" 2x15 Step downs 2" 2x15 Cablewalks, 4 way, 5x each direction, 17# pull   [facility] Adult PT Treatment:                                                DATE: 02/18/20 Therapeutic Exercise: Retrowalk 1.2 mph 10% grade x8 min QSs on heel block, 30x 3s hold SLR L 5# x30 to assess TKE Seated hamstring curl, total  gym, 25# x30 SLR hip circles against BTB, 60s each CW/CCW   Therapeutic Activity: Step ups 6" 2x15 Lateral step up 6" 2x15 Step downs 2" 2x15 Cablewalks, 4 way, 5x each direction, 13# pull      PATIENT EDUCATION:  Education details: continue current HEP Person educated: Patient Education method: Explanation, Demonstration, and Handouts Education comprehension: verbalized understanding and returned demonstration     HOME EXERCISE PROGRAM: Access Code: [email or web address] URL: [email or web address] Date: 02/19/2021 Prepared by: Sharlynn Oliphant  Exercises Sidelying Hip Abduction - 2 x daily - 7 x weekly - 1 sets - 30 reps Supine Straight Leg Raises - 2 x daily - 7 x weekly - 1 sets - 30 reps Hooklying Single Knee to Chest - 2 x daily - 7 x weekly - 1 sets - 3 reps - 30s hold Supine Figure 4 Piriformis Stretch - 2 x daily - 7 x weekly - 1 sets - 3 reps - 30s hold      ASSESSMENT:   CLINICAL IMPRESSION: Feels recent hip stretches have made some improvements in hip flexibility and has made it easier to put on shoes.  Attempted to raise step height on step downs but too much retropatellar pain encountered.  132d knee flexion measured supine actively.   REHAB POTENTIAL: Good   CLINICAL DECISION MAKING: Stable/uncomplicated   EVALUATION COMPLEXITY: Low     GOALS: Goals reviewed with patient? Yes   SHORT TERM GOALS:   STG Name Target Date Goal status  1 Patient to demonstrate independence in HEP  Baseline: Access Code: [email or web address]   02/05/2021 02/10/21 Goal met  2 Complete  STS w/o UE support Baseline: Requires BUE support 02/05/2021 02/10/21 Goal met  3 110d L knee flexion AROM Baseline:104d AROM in flexion 02/05/2021 132d flexion goal met  4 Decrease pain to 2/10 Baseline:4/10 02/05/2021 02/10/21 Goal met    LONG TERM GOALS:    LTG Name Target Date Goal status  1 135d AROM flexion in L knee Baseline:104d flexion 03/05/2021 02/10/21 132d flexion  2 FOTO score of  58 Baseline:41; 56 at 6th visit 03/05/2021 Ongoing  3 5/5 L knee extension Baseline: 4/5 03/05/2021 INITIAL  4 Patient to perform 5x STS in 15s w/o UE support Baseline: 03/05/2021 INITIAL    PLAN: PT FREQUENCY: 2x/week   PT DURATION: 8 weeks   PLANNED INTERVENTIONS: Therapeutic exercises, Therapeutic activity, Neuro Muscular re-education, Balance training, Gait training, Patient/Family education, Joint mobilization, Stair training, and DME instructions   PLAN FOR NEXT SESSION: Assess for DC, stairs    Leroy Sea PT 02/24/21 8:38 AM Phone: [phone] Fax: [phone]

## 2021-02-26 ENCOUNTER — Other Ambulatory Visit: Payer: Self-pay

## 2021-02-26 ENCOUNTER — Ambulatory Visit: Payer: No Typology Code available for payment source

## 2021-02-26 DIAGNOSIS — M25562 Pain in left knee: Secondary | ICD-10-CM

## 2021-02-26 DIAGNOSIS — R2681 Unsteadiness on feet: Secondary | ICD-10-CM

## 2021-02-26 DIAGNOSIS — M6281 Muscle weakness (generalized): Secondary | ICD-10-CM

## 2021-02-26 NOTE — Therapy (Signed)
OUTPATIENT PHYSICAL THERAPY TREATMENT NOTE/DC SUMMARY   Patient Name: Sean Sutton MRN: [id number] DOB:06-04-1960, 61 y.o., male Today's Date: 02/26/2021  PCP: [facility] REFERRING PROVIDER: Dara Lords, Herman,*  PHYSICAL THERAPY DISCHARGE SUMMARY  Visits from Start of Care: 15  Current functional level related to goals / functional outcomes: Goals met   Remaining deficits: Pain descending stairs   Education / Equipment: HEP   Patient agrees to discharge. Patient goals were met. Patient is being discharged due to meeting the stated rehab goals.     PT End of Session - 02/26/21 0957     Visit Number 15    Number of Visits 15    Date for PT Re-Evaluation 02/05/21    Authorization Type VA    Authorization Time Period 01/08/21-03/05/21    Progress Note Due on Visit 10    PT Start Time 1000    PT Stop Time 1045    PT Time Calculation (min) 45 min    Activity Tolerance Patient tolerated treatment well    Behavior During Therapy WFL for tasks assessed/performed             Past Medical History:  Diagnosis Date   Diabetes mellitus without complication ([place])    Hypertension    History reviewed. No pertinent surgical history. There are no problems to display for this patient.   REFERRING DIAG: L meniscus repair   THERAPY DIAG: L knee meniscus tear  PERTINENT HISTORY: see eval  PRECAUTIONS: none  SUBJECTIVE: Knee doing well, still reports pain with stairs, kneeling and deep squats PAIN:  Are you having pain? No VAS scale: 0/10 Pain location: L knee Pain orientation: Left  PAIN TYPE: stiff Pain description: intermittent   OBJECTIVE:    DIAGNOSTIC FINDINGS: N/A   PATIENT SURVEYS:  FOTO 41(eval), 56(6th), 64(DC)   MUSCLE LENGTH: Hamstrings: Right 80 deg; Left 70 deg Thomas test: negative L   POSTURE:  NT   PALPATION: TTP medial joint line L knee   LE AROM/PROM:   A/PROM Right 01/08/2021 Left 01/08/2021 Left  02/24/2021  Hip  flexion [facility]   Hip extension       Hip abduction       Hip adduction       Hip internal rotation       Hip external rotation       Knee flexion 134 104 132  Knee extension 9 -2/0   Ankle dorsiflexion       Ankle plantarflexion       Ankle inversion       Ankle eversion        (Blank rows = not tested)   LE MMT:   MMT Right 01/08/2021 Left 02/26/2021  Hip flexion 5 5  Hip extension 5 5  Hip abduction      Hip adduction      Hip internal rotation      Hip external rotation      Knee flexion 5 4+  Knee extension 5 5  Ankle dorsiflexion      Ankle plantarflexion      Ankle inversion      Ankle eversion       (Blank rows = not tested)   LOWER EXTREMITY SPECIAL TESTS:  Hip special tests: Marcello Moores test: negative Knee special tests: Patellafemoral apprehension test: negative and Patellafemoral grind test: negative   FUNCTIONAL TESTS:  5 times sit to stand: 16s with BUE support   GAIT: Distance walked: 274f Assistive device utilized:  None Level of assistance: Modified independence Comments: decreased WB LLE     TODAY'S TREATMENT: [facility] Adult PT Treatment:                                                DATE: 02/26/21 Therapeutic Exercise: Retrowalk 1.2 mph 15% grade x8 min FABER L 2 min hold QSs on heel block, 30x 3s hold Heel slides 30x   Therapeutic Activity: Stair negotiation, ascends step through, descends step to accommodate pain, retropatellar in nature Squats with ball squeeze 20x Step ups 6" 20x Lateral step up 6" 20x Step downs 2" 20x L lunges onto knee bolster, 20x UE support as needed   [facility] Adult PT Treatment:                                                DATE: 02/24/21 Therapeutic Exercise: Retrowalk 1.2 mph 15% grade x8 min FABER L 2 min hold SKTC L 30s x3 hold at proximal tibia to promote flexion mobilization QSs on heel block, 30x 3s hold SLR L 5# x30 to assess TKE Seated hamstring curl, total gym, 35# x30  Therapeutic Activity: Squats with  ball squeeze 20x Step ups 6" 20x Lateral step up 6" 20x Step downs 2" 20x L lunges onto knee bolster, 15x UE support as needed  [facility] Adult PT Treatment:                                                DATE: 02/19/21 Therapeutic Exercise: Retrowalk 1.2 mph 12% grade x8 min FABER L 2 min hold SKTC L 30s x3 hold at proximal tibia to promote flexion mobilization QSs on heel block, 30x 3s hold SLR L 5# x30 to assess TKE Seated hamstring curl, total gym, 25# x30  Therapeutic Activity: Squats with ball squeeze 15x Step ups 6" 2x15 Lateral step up 6" 2x15 Step downs 2" 2x15 Cablewalks, 4 way, 5x each direction, 17# pull   [facility] Adult PT Treatment:                                                DATE: 02/18/20 Therapeutic Exercise: Retrowalk 1.2 mph 10% grade x8 min QSs on heel block, 30x 3s hold SLR L 5# x30 to assess TKE Seated hamstring curl, total gym, 25# x30 SLR hip circles against BTB, 60s each CW/CCW   Therapeutic Activity: Step ups 6" 2x15 Lateral step up 6" 2x15 Step downs 2" 2x15 Cablewalks, 4 way, 5x each direction, 13# pull      PATIENT EDUCATION:  Education details: continue current HEP Person educated: Patient Education method: Explanation, Demonstration, and Handouts Education comprehension: verbalized understanding and returned demonstration     HOME EXERCISE PROGRAM: Access Code: [email or web address] URL: [email or web address] Date: 02/19/2021 Prepared by: Sharlynn Oliphant  Exercises Sidelying Hip Abduction - 2 x daily - 7 x weekly - 1 sets - 30 reps Supine Straight Leg Raises - 2 x daily - 7 x weekly - 1  sets - 30 reps Hooklying Single Knee to Chest - 2 x daily - 7 x weekly - 1 sets - 3 reps - 30s hold Supine Figure 4 Piriformis Stretch - 2 x daily - 7 x weekly - 1 sets - 3 reps - 30s hold      ASSESSMENT:   CLINICAL IMPRESSION: Goals met, 140d flexion measured, 8" step height still too painful, FOTO score exceeded, maximum benefit reached  REHAB  POTENTIAL: Good   CLINICAL DECISION MAKING: Stable/uncomplicated   EVALUATION COMPLEXITY: Low     GOALS: Goals reviewed with patient? Yes   SHORT TERM GOALS:   STG Name Target Date Goal status  1 Patient to demonstrate independence in HEP  Baseline: Access Code: [email or web address]   02/05/2021 02/10/21 Goal met  2 Complete  STS w/o UE support Baseline: Requires BUE support 02/05/2021 02/10/21 Goal met  3 110d L knee flexion AROM Baseline:104d AROM in flexion 02/05/2021 132d flexion goal met  4 Decrease pain to 2/10 Baseline:4/10 02/05/2021 02/10/21 Goal met    LONG TERM GOALS:    LTG Name Target Date Goal status  1 135d AROM flexion in L knee Baseline:104d flexion 03/05/2021 02/10/21 140d flexion, met  2 FOTO score of 58 Baseline:41; 56 at 6th visit; 02/26/21 64 03/05/2021 Met  3 5/5 L knee extension Baseline: 4/5 03/05/2021 Met  4 Patient to perform 5x STS in 15s w/o UE support Baseline: 02/26/21 11s 03/05/2021 Met    PLAN: PT FREQUENCY: 2x/week   PT DURATION: 8 weeks   PLANNED INTERVENTIONS: Therapeutic exercises, Therapeutic activity, Neuro Muscular re-education, Balance training, Gait training, Patient/Family education, Joint mobilization, Stair training, and DME instructions   PLAN FOR NEXT SESSION: DC to [employer] PT 02/26/21 9:58 AM Phone: [phone] Fax: [phone]

## 2021-03-05 ENCOUNTER — Ambulatory Visit: Payer: No Typology Code available for payment source

## 2021-03-25 DIAGNOSIS — E1129 Type 2 diabetes mellitus with other diabetic kidney complication: Secondary | ICD-10-CM | POA: Diagnosis not present

## 2021-03-25 DIAGNOSIS — I10 Essential (primary) hypertension: Secondary | ICD-10-CM | POA: Diagnosis not present

## 2021-03-25 DIAGNOSIS — R809 Proteinuria, unspecified: Secondary | ICD-10-CM | POA: Diagnosis not present

## 2021-03-25 DIAGNOSIS — E785 Hyperlipidemia, unspecified: Secondary | ICD-10-CM | POA: Diagnosis not present

## 2021-08-06 DIAGNOSIS — Z1211 Encounter for screening for malignant neoplasm of colon: Secondary | ICD-10-CM | POA: Diagnosis not present

## 2021-08-06 DIAGNOSIS — R809 Proteinuria, unspecified: Secondary | ICD-10-CM | POA: Diagnosis not present

## 2021-08-06 DIAGNOSIS — M25511 Pain in right shoulder: Secondary | ICD-10-CM | POA: Diagnosis not present

## 2021-08-06 DIAGNOSIS — E1129 Type 2 diabetes mellitus with other diabetic kidney complication: Secondary | ICD-10-CM | POA: Diagnosis not present

## 2021-08-06 DIAGNOSIS — I10 Essential (primary) hypertension: Secondary | ICD-10-CM | POA: Diagnosis not present

## 2021-08-06 DIAGNOSIS — E785 Hyperlipidemia, unspecified: Secondary | ICD-10-CM | POA: Diagnosis not present

## 2021-08-06 DIAGNOSIS — Z79899 Other long term (current) drug therapy: Secondary | ICD-10-CM | POA: Diagnosis not present

## 2021-08-08 DIAGNOSIS — E1129 Type 2 diabetes mellitus with other diabetic kidney complication: Secondary | ICD-10-CM | POA: Diagnosis not present

## 2021-08-08 DIAGNOSIS — E785 Hyperlipidemia, unspecified: Secondary | ICD-10-CM | POA: Diagnosis not present

## 2021-08-08 DIAGNOSIS — Z79899 Other long term (current) drug therapy: Secondary | ICD-10-CM | POA: Diagnosis not present

## 2021-08-13 DIAGNOSIS — Z1211 Encounter for screening for malignant neoplasm of colon: Secondary | ICD-10-CM | POA: Diagnosis not present

## 2021-08-13 DIAGNOSIS — Z1212 Encounter for screening for malignant neoplasm of rectum: Secondary | ICD-10-CM | POA: Diagnosis not present

## 2021-08-20 DIAGNOSIS — M25511 Pain in right shoulder: Secondary | ICD-10-CM | POA: Diagnosis not present

## 2021-09-28 IMAGING — CT CT HEAD W/O CM
3 series · 15 of 47 positions shown, 18 images · non-contrast
Comparison: None.

CLINICAL DATA: Right facial droop.

EXAM:
CT HEAD WITHOUT CONTRAST
TECHNIQUE: Contiguous axial images were obtained from the base of the skull
through the vertex without intravenous contrast.

[Series 2: head wo · axial · 0.47mm/px · z∈[-99,+41]mm · 9 of 34 slices shown, 12 images]
[im 3/34  brain]
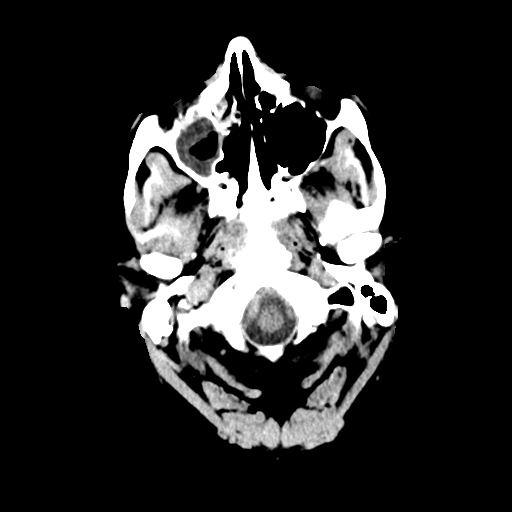
[im 3/34  bone]
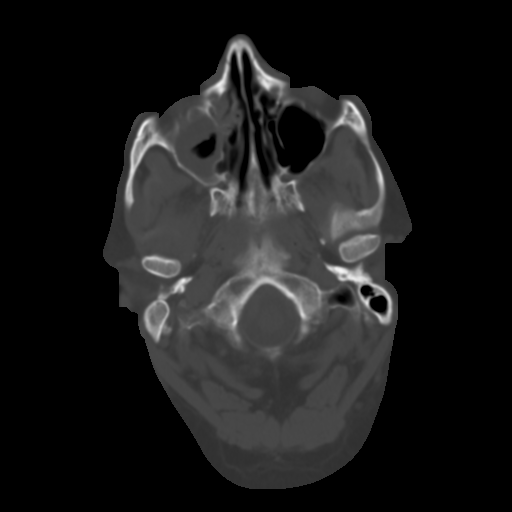
[im 6/34  brain]
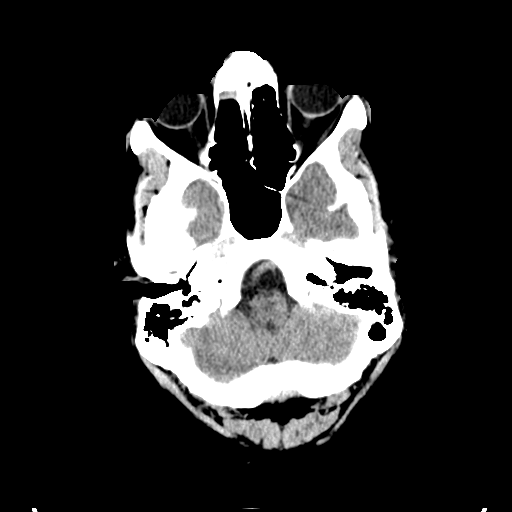
[im 10/34  brain]
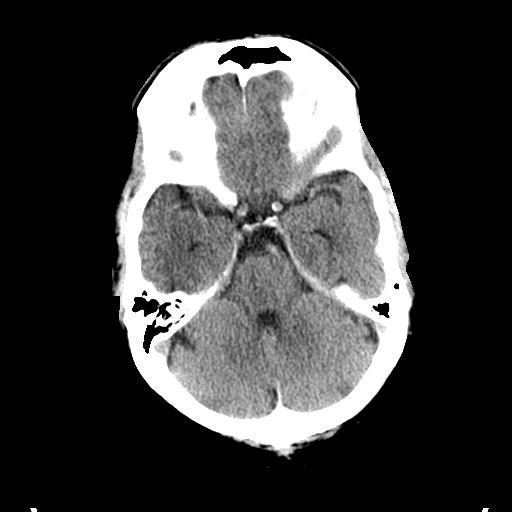
[im 13/34  brain]
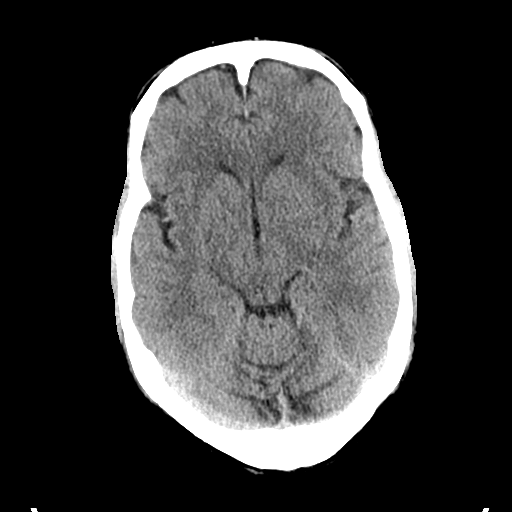
[im 18/34  brain]
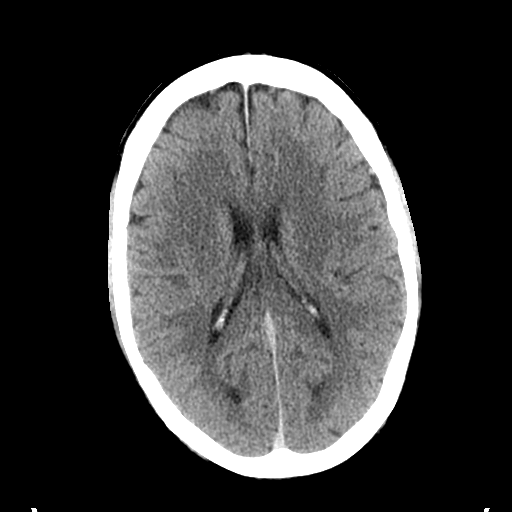
[im 18/34  bone]
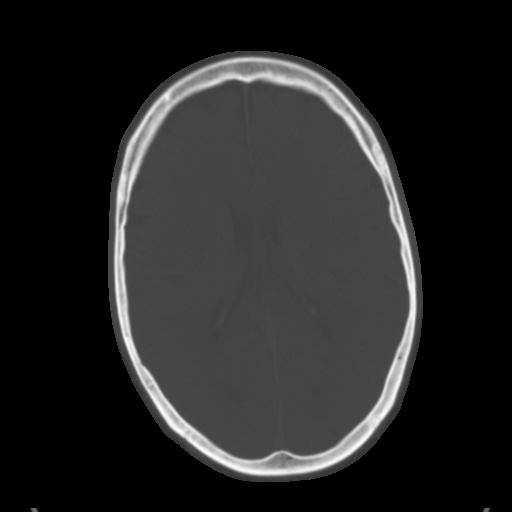
[im 21/34  brain]
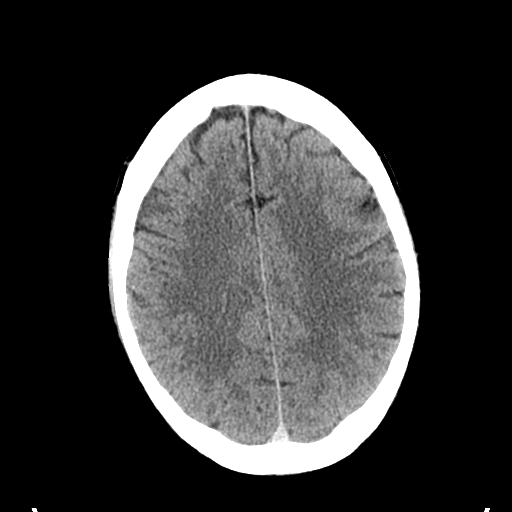
[im 24/34  brain]
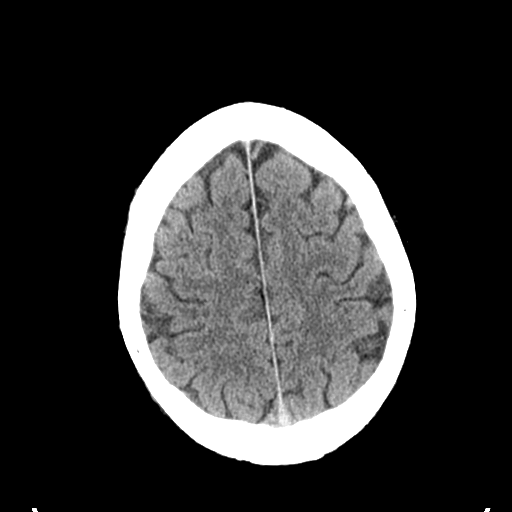
[im 28/34  brain]
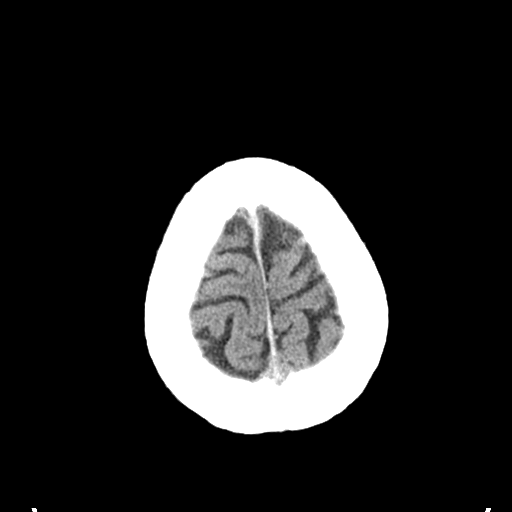
[im 31/34  brain]
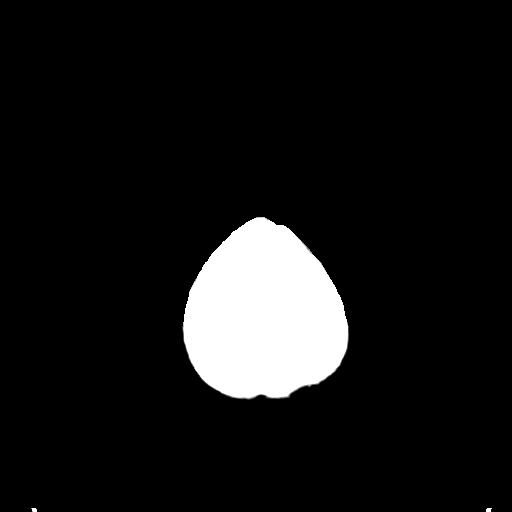
[im 31/34  bone]
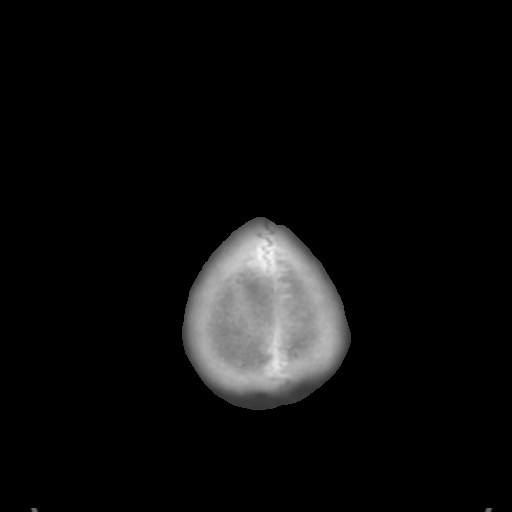

[Series 4: coronal soft tissue · coronal · 0.33mm/px · 3 of 76 slices shown]
[im 26/76  brain]
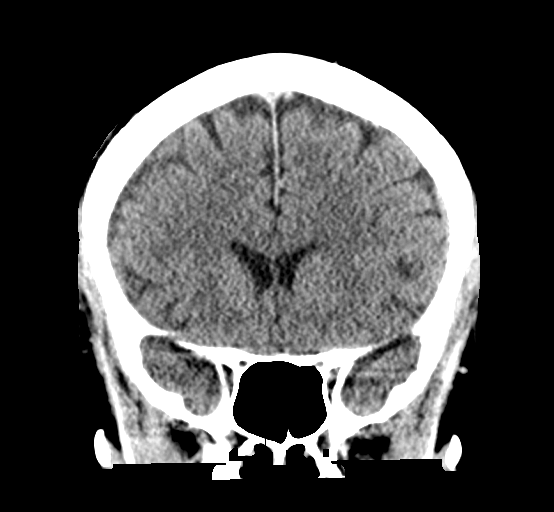
[im 34/76  brain]
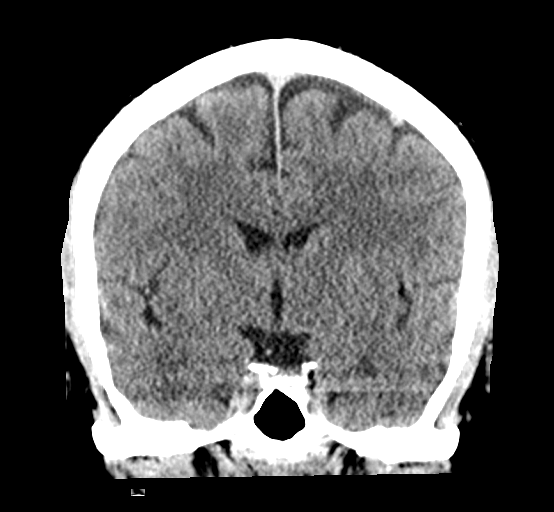
[im 42/76  brain]
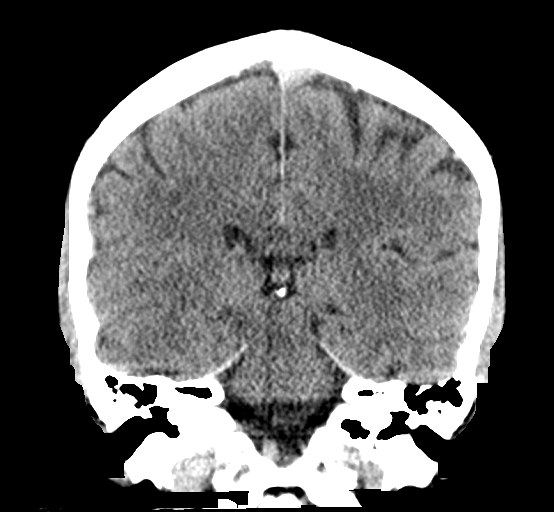

[Series 5: sagittal soft tissue · sagittal · 0.34mm/px · 3 of 63 slices shown]
[im 21/63  brain]
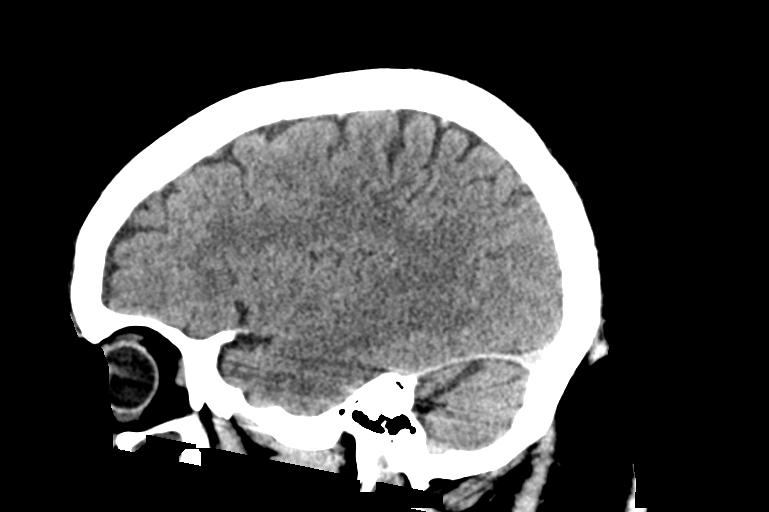
[im 32/63  brain]
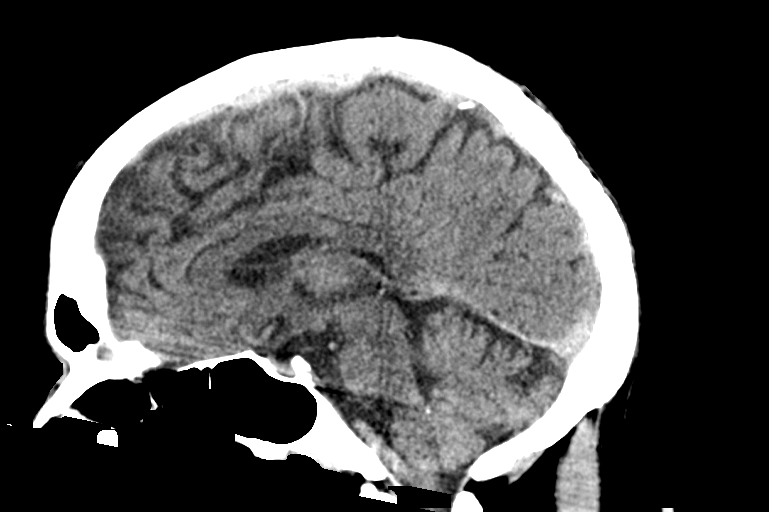
[im 42/63  brain]
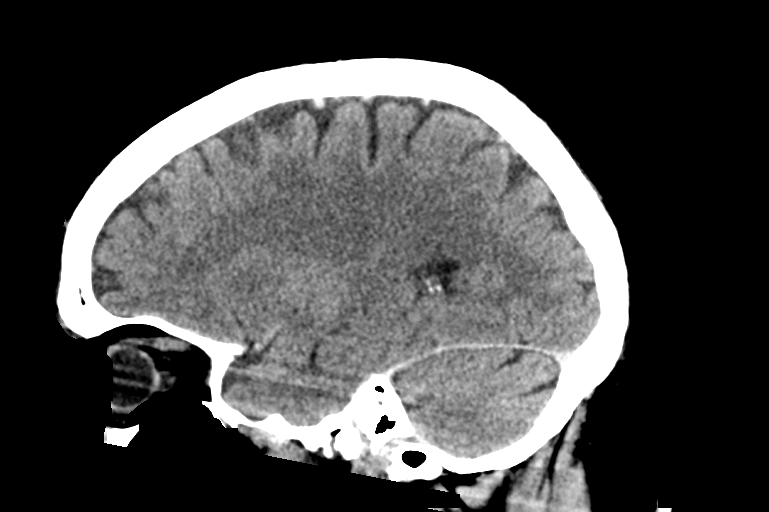

[15 of 47 positions shown; findings below may reference images not displayed]

FINDINGS: Brain: No evidence of acute infarction, hemorrhage, hydrocephalus,
extra-axial collection or mass lesion/mass effect.

Vascular: No hyperdense vessel or unexpected calcification.

Skull: Normal. Negative for fracture or focal lesion.

Sinuses/Orbits: Right maxillary sinusitis is noted.

Other: None.
IMPRESSION: Right maxillary sinusitis. No acute intracranial abnormality seen.
